# Patient Record
Sex: Male | Born: 1995 | Race: Black or African American | Hispanic: No | Marital: Single | Smoking: Never smoker
Health system: Southern US, Community
[De-identification: ages and names within clinical notes are randomized; demographics above are authoritative.]

## PROBLEM LIST (undated history)

## (undated) ENCOUNTER — Emergency Department (HOSPITAL_COMMUNITY): Payer: No Typology Code available for payment source

## (undated) DIAGNOSIS — J45909 Unspecified asthma, uncomplicated: Secondary | ICD-10-CM

## (undated) DIAGNOSIS — S022XXA Fracture of nasal bones, initial encounter for closed fracture: Secondary | ICD-10-CM

## (undated) DIAGNOSIS — S060X9A Concussion with loss of consciousness of unspecified duration, initial encounter: Secondary | ICD-10-CM

## (undated) HISTORY — DX: Concussion with loss of consciousness of unspecified duration, initial encounter: S06.0X9A

## (undated) HISTORY — DX: Fracture of nasal bones, initial encounter for closed fracture: S02.2XXA

---
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
## 2017-11-01 ENCOUNTER — Encounter (HOSPITAL_COMMUNITY): Payer: Self-pay | Admitting: *Deleted

## 2017-11-01 ENCOUNTER — Other Ambulatory Visit: Payer: Self-pay

## 2017-11-01 ENCOUNTER — Ambulatory Visit (HOSPITAL_COMMUNITY)
Admission: EM | Admit: 2017-11-01 | Discharge: 2017-11-01 | Disposition: A | Discharge status: Home / Self Care | Payer: Medicaid Other | Attending: Physician Assistant | Admitting: Physician Assistant

## 2017-11-01 DIAGNOSIS — R0781 Pleurodynia: Secondary | ICD-10-CM | POA: Diagnosis not present

## 2017-11-01 MED ORDER — NAPROXEN SODIUM 550 MG PO TABS: 550.0000 mg | ORAL_TABLET | Freq: Two times a day (BID) | ORAL | 0 refills | Status: DC

## 2017-11-01 NOTE — ED Provider Notes (Signed)
11/01/2017 3:34 PM   DOB: 1996-02-11 / MRN: [id number]  SUBJECTIVE:  Bruce Rios is a 22 y.o. male presenting for right-sided rib pain that started last Friday after a basketball game in which he was "going hard."  Denies shortness of breath and chest pain.  Denies leg swelling.  He has No Known Allergies.   He  has a past medical history of Asthma.    He  reports that he has never smoked. He has never used smokeless tobacco. He reports that he has current or past drug history. Drug: Marijuana. He reports that he does not drink alcohol. He  has no sexual activity history on file. The patient  has no past surgical history on file.  His family history is not on file.  Review of Systems  Constitutional: Positive for fever.  Gastrointestinal: Negative for nausea.  Musculoskeletal: Negative for myalgias.  Neurological: Negative for dizziness.    OBJECTIVE:  BP 129/74 (BP Location: Left Arm)   Pulse 74   Temp (!) 97.4 F (36.3 C) (Oral)   SpO2 100%   Wt Readings from Last 3 Encounters:  04/08/16 193 lb (87.5 kg) (92 %, Z= 1.42)*  02/18/16 190 lb 9.6 oz (86.5 kg) (92 %, Z= 1.38)*  11/22/15 192 lb 12.8 oz (87.5 kg) (93 %, Z= 1.47)*   * Growth percentiles are based on CDC (Boys, 2-20 Years) data.   Temp Readings from Last 3 Encounters:  11/01/17 (!) 97.4 F (36.3 C) (Oral)  12/04/16 98.2 F (36.8 C) (Oral)  11/15/16 98.1 F (36.7 C) (Oral)   BP Readings from Last 3 Encounters:  11/01/17 129/74  12/04/16 127/82  11/15/16 127/79   Pulse Readings from Last 3 Encounters:  11/01/17 74  12/04/16 67  11/15/16 64    Physical Exam  Constitutional: He is oriented to person, place, and time. He appears well-developed. He does not appear ill.  Eyes: Pupils are equal, round, and reactive to light. Conjunctivae and EOM are normal.  Cardiovascular: Normal rate, regular rhythm, S1 normal, S2 normal, normal heart sounds, intact distal pulses and normal pulses. Exam reveals no gallop  and no friction rub.  No murmur heard. Pulmonary/Chest: Effort normal. No stridor. No respiratory distress. He has no wheezes. He has no rales.    Abdominal: He exhibits no distension.  Musculoskeletal: Normal range of motion. He exhibits no edema.  Neurological: He is alert and oriented to person, place, and time. No cranial nerve deficit. Coordination normal.  Skin: Skin is warm and dry. He is not diaphoretic.  Psychiatric: He has a normal mood and affect.  Nursing note and vitals reviewed.   No results found for this or any previous visit (from the past 72 hour(s)).  No results found.  ASSESSMENT AND PLAN:   Rib pain on right side reassuring exam.  No leg swelling.  Lung sounds normal.  Advised he will need to get this time.    Discharge Instructions     Please take the naproxen as prescribed.  Take this with food.  If you need to you can take at thousand milligrams of Tylenol every 8 hours along with the naproxen.  If at any point become short of breath then please go to the emergency department.        The patient is advised to call or return to clinic if he does not see an improvement in symptoms, or to seek the care of the closest emergency department if he worsens with the above  plan.   Deliah Boston, MHS, PA-C 11/01/2017 3:34 PM   Ofilia Neas, PA-C 11/01/17 1536

## 2017-11-01 NOTE — ED Triage Notes (Signed)
Pt c/o of right rib pain

## 2017-11-01 NOTE — Discharge Instructions (Addendum)
Please take the naproxen as prescribed.  Take this with food.  If you need to you can take at thousand milligrams of Tylenol every 8 hours along with the naproxen.  If at any point become short of breath then please go to the emergency department.
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
## 2018-06-07 ENCOUNTER — Other Ambulatory Visit: Payer: Self-pay

## 2018-06-07 ENCOUNTER — Encounter (HOSPITAL_COMMUNITY): Payer: Self-pay | Admitting: Emergency Medicine

## 2018-06-07 ENCOUNTER — Ambulatory Visit (HOSPITAL_COMMUNITY)
Admission: EM | Admit: 2018-06-07 | Discharge: 2018-06-07 | Disposition: A | Discharge status: Home / Self Care | Payer: Medicaid Other | Attending: Urgent Care | Admitting: Urgent Care

## 2018-06-07 DIAGNOSIS — R52 Pain, unspecified: Secondary | ICD-10-CM | POA: Insufficient documentation

## 2018-06-07 DIAGNOSIS — J029 Acute pharyngitis, unspecified: Secondary | ICD-10-CM

## 2018-06-07 DIAGNOSIS — M7918 Myalgia, other site: Secondary | ICD-10-CM

## 2018-06-07 DIAGNOSIS — R05 Cough: Secondary | ICD-10-CM

## 2018-06-07 DIAGNOSIS — R519 Headache, unspecified: Secondary | ICD-10-CM

## 2018-06-07 DIAGNOSIS — R51 Headache: Secondary | ICD-10-CM | POA: Insufficient documentation

## 2018-06-07 DIAGNOSIS — R69 Illness, unspecified: Secondary | ICD-10-CM | POA: Insufficient documentation

## 2018-06-07 DIAGNOSIS — R5381 Other malaise: Secondary | ICD-10-CM | POA: Insufficient documentation

## 2018-06-07 DIAGNOSIS — J111 Influenza due to unidentified influenza virus with other respiratory manifestations: Secondary | ICD-10-CM

## 2018-06-07 MED ORDER — CETIRIZINE HCL 10 MG PO TABS: 10.0000 mg | ORAL_TABLET | Freq: Every day | ORAL | 11 refills | Status: DC

## 2018-06-07 MED ORDER — IBUPROFEN 600 MG PO TABS: 600.0000 mg | ORAL_TABLET | Freq: Four times a day (QID) | ORAL | 0 refills | Status: DC | PRN

## 2018-06-07 MED ORDER — PSEUDOEPHEDRINE HCL ER 120 MG PO TB12: 120.0000 mg | ORAL_TABLET | Freq: Two times a day (BID) | ORAL | 3 refills | Status: DC

## 2018-06-07 NOTE — ED Triage Notes (Signed)
Headache and fever for 2-3 days ago.  Patient also has a cough

## 2018-06-07 NOTE — Discharge Instructions (Signed)
You may take 500mg  Tylenol with ibuprofen 600mg  with food every 8 hours for pain and inflammation. For sore throat try using a honey-based tea. Use 3 teaspoons of honey with juice squeezed from half lemon. Place shaved pieces of ginger into 1/2-1 cup of water and warm over stove top. Then mix the ingredients and repeat every 4 hours as needed. Use Zyrtec, Sudafed to help with congestion, sinus headaches.

## 2018-06-07 NOTE — ED Provider Notes (Addendum)
  MRN: [id number] DOB: 15-May-1996  Subjective:   Din L Jennette Kettleeal is a 22 y.o. male presenting for 3 day history of body aches, fever, right temporal-frontal headache, worsening malaise. Has sinus congestion, mild sharp/achy throat pain, dry cough that elicits chest pain, nausea without vomiting, occasional and intermittent belly pain. Patient was sent home from work today due to his illness. Has tried APAP. Has a history of childhood asthma. Does not have a current albuterol inhaler. Is generally very healthy, last illness was around the same time each year. Denies smoking cigarettes. Does not take any current medications.    No Known Allergies  Past Medical History:  Diagnosis Date  . Asthma     Had adenoidectomy, wisdom tooth extraction.  Objective:   Vitals: BP 135/78 (BP Location: Right Arm)   Pulse 78   Temp 98.4 F (36.9 C) (Oral)   Resp 18   SpO2 100%   Physical Exam Constitutional:      General: He is not in acute distress.    Appearance: Normal appearance. He is well-developed and normal weight. He is not ill-appearing, toxic-appearing or diaphoretic.  HENT:     Head: Normocephalic and atraumatic.     Right Ear: Tympanic membrane, ear canal and external ear normal.     Left Ear: Tympanic membrane, ear canal and external ear normal.     Nose: No congestion or rhinorrhea.     Mouth/Throat:     Mouth: Mucous membranes are moist.     Pharynx: Oropharynx is clear. No oropharyngeal exudate or posterior oropharyngeal erythema.  Eyes:     General: No scleral icterus.       Right eye: No discharge.        Left eye: No discharge.     Extraocular Movements: Extraocular movements intact.     Pupils: Pupils are equal, round, and reactive to light.  Neck:     Musculoskeletal: Normal range of motion and neck supple. No neck rigidity or muscular tenderness.  Cardiovascular:     Rate and Rhythm: Normal rate and regular rhythm.     Pulses: Normal pulses.     Heart sounds: Normal  heart sounds. No murmur. No friction rub. No gallop.   Pulmonary:     Effort: Pulmonary effort is normal. No respiratory distress.     Breath sounds: Normal breath sounds. No stridor. No wheezing, rhonchi or rales.  Lymphadenopathy:     Cervical: No cervical adenopathy.  Skin:    General: Skin is warm and dry.  Neurological:     Mental Status: He is alert and oriented to person, place, and time.  Psychiatric:        Mood and Affect: Mood normal.        Behavior: Behavior normal.        Thought Content: Thought content normal.    Assessment and Plan :   Influenza-like illness  Body aches  Malaise  Acute nonintractable headache, unspecified headache type  Will manage for influenza-like illness, has reassuring physical exam findings. Advised supportive care, offered symptomatic relief. Return-to-clinic precautions discussed, patient verbalized understanding.       Wallis BambergMani, Ernesto Lashway, PA-C 06/07/18 1813    Wallis BambergMani, Acen Craun, PA-C 06/07/18 [phone]

## 2018-06-14 ENCOUNTER — Ambulatory Visit (INDEPENDENT_AMBULATORY_CARE_PROVIDER_SITE_OTHER): Payer: Self-pay | Admitting: Family Medicine

## 2018-06-14 VITALS — BP 114/80 | HR 73 | Temp 98.4°F | Wt 189.0 lb

## 2018-06-14 DIAGNOSIS — R6889 Other general symptoms and signs: Secondary | ICD-10-CM

## 2018-06-14 NOTE — Progress Notes (Signed)
  Subjective:    Patient ID: Bruce Rios, male    DOB: 06-13-1995, 23 y.o.   MRN: [id number]   CC: Flulike symptoms  HPI:  Flulike symptoms Patient reports that he was previously diagnosed with the flu at urgent care on 12/30.  At that time he was told to stay out of work until at least 1/02.  Patient reports that he has been having fevers and chills with body aches.  Denies any vomiting or diarrhea.  Does report that he has been having some nausea.  Patient denies any chest pain or shortness of breath.  He reports that he is overall feeling a little bit better but that he is still having fevers.  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 Patient states that his supervisor at [facility] would not take the excuse provided by the urgent care provider for him having the flu.  And he would like an excuse that would cover him to be out of work.  Smoking status reviewed  ROS: 10 point ROS is otherwise negative, except as mentioned in HPI  Patient Active Problem List   Diagnosis Date Noted  . Flu-like symptoms 06/16/2018  . RHINITIS, ALLERGIC 08/06/2006  . ASTHMA, PERSISTENT, SEVERE 08/06/2006  . ECZEMA, ATOPIC DERMATITIS 08/06/2006     Objective:  BP 114/80   Pulse 73   Temp 98.4 F (36.9 C)   Wt 189 lb (85.7 kg)   SpO2 97%   BMI 22.71 kg/m  Vitals and nursing note reviewed  General: NAD, pleasant, well-appearing Cardiac: RRR, normal heart sounds, no murmurs Respiratory: CTAB, normal effort Extremities: no edema or cyanosis. WWP. Skin: warm and dry, no rashes noted Neuro: alert and oriented, no focal deficits Psych: normal affect  Assessment & Plan:    Flu-like symptoms Patient previously diagnosed with the flu on 12/30.  Overall patient reports that he is improving in symptoms but has been febrile today at home.  Will write for a note for him to be out of work until he was least fever free for 24 hours given that he is  around many people at his job at [employer]. Otherwise continue symptomatic treatment for the flu. Given handout on nasal saline wash in order to help with his congestion.  Honey for cough.  Continue ibuprofen and Tylenol as needed for fever and pain. Patient's lungs are clear today with no respiratory distress.  Patient given strict return precautions.    [place] Casson Catena, DO Family Medicine Resident PGY-2

## 2018-06-14 NOTE — Patient Instructions (Addendum)
Thank you for coming to see me today. It was a pleasure! Today we talked about:   You have the flu and it should start to get better about 10-14 days after it started.    For your cough, try honey scheduled, may use in tea.   For your nasal congestion and runny nose, try using Afrin (generic is Oxymetazoline) twice daily for 3 days.  Do not use for longer that 3 days.   You may also try guaifenesin to help with the congestion.    Some other therapies you can try are: push fluids, rest, use vaporizer or mist prn, apply heat to sinuses prn and return office visit prn if symptoms persist or worsen.   Drinking warm liquids such as teas and soups can help with secretions and cough. A mist humidifier or vaporizer can work well to help with secretions and cough.  It is very important to clean the humidifier between use according to the instructions.    It was good to see you.  If you're still having trouble in the next week, come back and see [place].    Of course, if you start having trouble breathing, worsening fevers, vomiting and unable to hold down any fluids, or you have other concerns, don't hesitate to come back or go to the ED after hours.   Please follow-up with your regular doctor as needed.  If you have any questions or concerns, please do not hesitate to call the office at [phone].  Take Care,   [place] Griffen Frayne, DO  You may rinse out your sinuses as we talked about with the following product. There are coupons available. Please use DISTILLED or FILTERED water and clean out the product after use as directed.

## 2018-06-16 ENCOUNTER — Encounter: Payer: Self-pay | Admitting: Family Medicine

## 2018-06-16 DIAGNOSIS — R6889 Other general symptoms and signs: Secondary | ICD-10-CM | POA: Insufficient documentation

## 2018-06-16 NOTE — Assessment & Plan Note (Addendum)
Patient previously diagnosed with the flu on 12/30.  Overall patient reports that he is improving in symptoms but has been febrile today at home.  Will write for a note for him to be out of work until he was least fever free for 24 hours given that he is around many people at his job at [employer]. Otherwise continue symptomatic treatment for the flu. Given handout on nasal saline wash in order to help with his congestion.  Honey for cough.  Continue ibuprofen and Tylenol as needed for fever and pain. Patient's lungs are clear today with no respiratory distress.  Patient given strict return precautions.

## 2018-06-18 ENCOUNTER — Telehealth: Payer: Self-pay | Admitting: Family Medicine

## 2018-06-18 NOTE — Telephone Encounter (Signed)
Patient's Mom dropped of forms to be signed. She will pick up when completed.

## 2018-06-18 NOTE — Telephone Encounter (Signed)
Forwarding to [facility] team. Aquilla Solian, CMA

## 2018-06-18 NOTE — Telephone Encounter (Signed)
Clinical info completed on FMLA form.  Place form in Dr. Lyttle's box for completion.  Zimmerman Rumple, April D, CMA  

## 2018-06-21 NOTE — Telephone Encounter (Signed)
Dr. [place] saw him and wrote him out for the flu--so I gave her the form to fill out Bruce Rios

## 2018-06-22 NOTE — Telephone Encounter (Signed)
Attempted to contact pt and mother with numbers provided on form and in chart without success, and unable to LVM. Pts forms are up front for pick up, if mom calls asking about them.

## 2018-06-22 NOTE — Telephone Encounter (Signed)
Form completed. Will place in RN box

## 2018-07-05 NOTE — Telephone Encounter (Signed)
Mom called to check status.  Informed that they were ready for pickup. Jamarian Jacinto, Maryjo Rochester, CMA

## 2018-07-16 ENCOUNTER — Ambulatory Visit
Admission: EM | Admit: 2018-07-16 | Discharge: 2018-07-16 | Disposition: A | Discharge status: Home / Self Care | Payer: Medicaid Other | Attending: Family Medicine | Admitting: Family Medicine

## 2018-07-16 ENCOUNTER — Encounter: Payer: Self-pay | Admitting: Emergency Medicine

## 2018-07-16 DIAGNOSIS — S161XXA Strain of muscle, fascia and tendon at neck level, initial encounter: Secondary | ICD-10-CM

## 2018-07-16 MED ORDER — IBUPROFEN 800 MG PO TABS: 800.0000 mg | ORAL_TABLET | Freq: Three times a day (TID) | ORAL | 0 refills | Status: DC

## 2018-07-16 MED ORDER — CYCLOBENZAPRINE HCL 5 MG PO TABS: 5.0000 mg | ORAL_TABLET | Freq: Two times a day (BID) | ORAL | 0 refills | Status: DC | PRN
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
## 2018-07-16 NOTE — Discharge Instructions (Signed)
Your pain may worsen over the next 2 to 3 days he may develop some other aches and pains.  In total I would expect your pain to gradually improve over the following 2 to 3 weeks afterwards.  Use anti-inflammatories for pain/swelling. You may take up to 800 mg Ibuprofen every 8 hours with food. You may supplement Ibuprofen with Tylenol [phone] mg every 8 hours.   You may use flexeril as needed to help with pain. This is a muscle relaxer and causes sedation- please use only at bedtime or when you will be home and not have to drive/work-begin with 1 tablet, may increase to 2 if not causing significant drowsiness  Alternate ice and heat  Recommend gradual gentle neck stretching  Follow-up if symptoms not resolving or worsening
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
## 2018-07-16 NOTE — ED Notes (Signed)
Patient able to ambulate independently  

## 2018-07-24 ENCOUNTER — Other Ambulatory Visit: Payer: Self-pay

## 2018-07-24 ENCOUNTER — Ambulatory Visit
Admission: EM | Admit: 2018-07-24 | Discharge: 2018-07-24 | Disposition: A | Discharge status: Home / Self Care | Payer: Medicaid Other | Attending: Family Medicine | Admitting: Family Medicine

## 2018-07-24 DIAGNOSIS — S46811D Strain of other muscles, fascia and tendons at shoulder and upper arm level, right arm, subsequent encounter: Secondary | ICD-10-CM

## 2018-07-24 MED ORDER — DICLOFENAC SODIUM 75 MG PO TBEC: 75.0000 mg | DELAYED_RELEASE_TABLET | Freq: Two times a day (BID) | ORAL | 0 refills | Status: DC
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
## 2018-07-24 NOTE — Discharge Instructions (Addendum)
Continue the hot showers and gentle range of motion of the neck  See Dr. Denny Levy next week

## 2018-09-20 ENCOUNTER — Encounter: Payer: Self-pay | Admitting: Family Medicine

## 2018-09-20 ENCOUNTER — Other Ambulatory Visit: Payer: Self-pay

## 2018-09-20 ENCOUNTER — Telehealth (INDEPENDENT_AMBULATORY_CARE_PROVIDER_SITE_OTHER): Payer: Self-pay | Admitting: Family Medicine

## 2018-09-20 DIAGNOSIS — B349 Viral infection, unspecified: Secondary | ICD-10-CM

## 2018-09-20 DIAGNOSIS — J4530 Mild persistent asthma, uncomplicated: Secondary | ICD-10-CM

## 2018-09-20 MED ORDER — BECLOMETHASONE DIPROP HFA 80 MCG/ACT IN AERB: 1.0000 | INHALATION_SPRAY | Freq: Two times a day (BID) | RESPIRATORY_TRACT | 3 refills | Status: DC

## 2018-09-20 MED ORDER — ALBUTEROL SULFATE HFA 108 (90 BASE) MCG/ACT IN AERS: 2.0000 | INHALATION_SPRAY | Freq: Four times a day (QID) | RESPIRATORY_TRACT | 0 refills | Status: DC | PRN

## 2018-09-20 NOTE — Progress Notes (Signed)
Please letter to patient's home.  Bruce Starr, MD  Family Medicine Teaching Service

## 2018-09-20 NOTE — Progress Notes (Signed)
[facility] Telemedicine Visit  Patient consented to have virtual visit. Method of visit: Video was attempted, but technology challenges prevented patient from using video, so visit was conducted via telephone.  Encounter participants: Patient: Bruce Rios - located at home with mother  Provider: [name] - located at [facility]    Chief Complaint: Not feeling well for 3 days  HPI: Bruce Rios is a 23 year old gentleman with no significant medical history presenting via virtual visit with concerns for headache nausea and general malaise.  The patient currently works in a distribution center.  He reports for 3 days he has had a moderate quality right-sided throbbing headache.  He has a history of intermittent headaches.  This is similar to prior.  The headache comes and goes during the day.  He also endorses some chills, malaise, fatigue and nausea.  He also has a dry cough.  He specifically denies documented fevers, vomiting, diarrhea, dyspnea on exertion, chest pain or severe headache.  He has been eating and drinking well.  Denies melena or hematochezia.  He has not been using any illicit substances.  He has not been drinking alcohol.  He reports he was told to stay home from work for 14 days.  He would like to be tested for coronavirus.    ROS: per HPI  Pertinent PMHx:  Asthma, currently well controlled.  Patient not on any controller medications currently.  Exam:  Respiratory: Speaking in full sentences.  No signs of respiratory distress.  Exam limited by phone consultation  Assessment/Plan:   Diagnoses and all orders for this visit:  Viral syndrome, reviewed symptomatic care.  Recommended Tylenol rather than ibuprofen.  Reviewed strict return precautions.  It is certainly possible that the patient has a mild very mild case of coronavirus.  We reviewed that the illness could worsen.  We will send a refill for his albuterol in the case that he needs to use  this.  Should he become sort of breath or have other symptoms he should present to the emergency department.  Work note given for remainder of week.  Time spent during visit with patient: 10 minutes

## 2019-01-19 ENCOUNTER — Ambulatory Visit
Admission: EM | Admit: 2019-01-19 | Discharge: 2019-01-19 | Disposition: A | Discharge status: Home / Self Care | Payer: Medicaid Other | Attending: Physician Assistant | Admitting: Physician Assistant

## 2019-01-19 DIAGNOSIS — G8929 Other chronic pain: Secondary | ICD-10-CM

## 2019-01-19 DIAGNOSIS — M545 Low back pain, unspecified: Secondary | ICD-10-CM

## 2019-01-19 DIAGNOSIS — M549 Dorsalgia, unspecified: Secondary | ICD-10-CM

## 2019-01-19 DIAGNOSIS — M542 Cervicalgia: Secondary | ICD-10-CM

## 2019-01-19 MED ORDER — MELOXICAM 7.5 MG PO TABS: 7.5000 mg | ORAL_TABLET | Freq: Every day | ORAL | 0 refills | Status: DC
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
## 2019-01-19 NOTE — Discharge Instructions (Signed)
Start Mobic. Do not take ibuprofen (motrin/advil)/ naproxen (aleve) while on mobic. Follow up with your PCP for further evaluation if symptoms not improving.
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
## 2019-02-23 ENCOUNTER — Other Ambulatory Visit: Payer: Self-pay

## 2019-02-23 ENCOUNTER — Ambulatory Visit
Admission: EM | Admit: 2019-02-23 | Discharge: 2019-02-23 | Disposition: A | Discharge status: Home / Self Care | Payer: Medicaid Other | Attending: Physician Assistant | Admitting: Physician Assistant

## 2019-02-23 DIAGNOSIS — R1030 Lower abdominal pain, unspecified: Secondary | ICD-10-CM

## 2019-02-23 MED ORDER — POLYETHYLENE GLYCOL 3350 17 G PO PACK: 17.0000 g | PACK | Freq: Every day | ORAL | 0 refills | Status: DC

## 2019-02-23 NOTE — Discharge Instructions (Addendum)
Eat smaller more frequent meals. Take miralax daily to help with possible constipation. Follow up with PCP for erectile dysfunction. If no improvement in stomach pain follow up with PCP. Go to ED for fever, severe abdominal pain that causes you to double over with nausea and/or vomiting.

## 2019-02-23 NOTE — ED Triage Notes (Signed)
Pt presents to UC w/ c/o abdominal pain x4 days. Pt deneies N/V or diarrhea. Pt reports feeling weak.

## 2019-02-23 NOTE — ED Provider Notes (Signed)
[facility]    CSN: [id number] Arrival date & time: 02/23/19  1218      History   Chief Complaint No chief complaint on file.   HPI Bruce Rios is a 23 y.o. male.   23 yo presents with bilateral lower abdominal pain x 4 days. He reports cutting his left testicle while shaving 4 days ago. He is concerned about blood loss because he "bled a lot". Bleeding subsided day of incident, denies current bleeding. He denies dizziness or syncope. He felt as if abdominal pain occurred shortly after bleeding subsided. States pain is hard to describes but makes him "feel like I could throw up", however, he then denies nausea. Denies V/D. Denies fever. Denies heartburn. Reports daily bowel movement with occasional hard stools and straining. Reports eating one large meal a day. Food improves stomach pain for a short period.   Also concerned that he has been unable to maintain an erection for the last few days. Denies discharge, lesion, or dysuria. He is sexually active with one partner and uses condoms.      Past Medical History:  Diagnosis Date  . Asthma   . Closed fracture of nasal bone 10/24/2015   10/20/2015: CT BRAIN AND FACIAL BONES WO CONTRAST:  IMPRESSION:  - Suspected very subtle subluxation between the frontal process of the maxilla on the left and the left nasal bone, versus a tiny fracture. Adjacent mild soft tissue swelling. No other fracture demonstrated. - No acute intracranial pathology demonstrated.   . Concussion with loss of consciousness 10/24/2015    Patient Active Problem List   Diagnosis Date Noted  . Flu-like symptoms 06/16/2018  . RHINITIS, ALLERGIC 08/06/2006  . ASTHMA, PERSISTENT, SEVERE 08/06/2006  . ECZEMA, ATOPIC DERMATITIS 08/06/2006    History reviewed. No pertinent surgical history.     Home Medications    Prior to Admission medications   Medication Sig Start Date End Date Taking? Authorizing Provider  albuterol (PROVENTIL HFA;VENTOLIN HFA)  108 (90 Base) MCG/ACT inhaler Inhale 2 puffs into the lungs every 6 (six) hours as needed for wheezing or shortness of breath. 09/20/18   Martyn Malay, MD  beclomethasone (QVAR REDIHALER) 80 MCG/ACT inhaler Inhale 1 puff into the lungs 2 (two) times daily. 09/20/18   Martyn Malay, MD  cetirizine (ZYRTEC) 10 MG tablet Take 1 tablet (10 mg total) by mouth daily. 06/07/18   Jaynee Eagles, PA-C  meloxicam (MOBIC) 7.5 MG tablet Take 1 tablet (7.5 mg total) by mouth daily. 01/19/19   Tasia Catchings, Taliyah Watrous V, PA-C  polyethylene glycol (MIRALAX) 17 g packet Take 17 g by mouth daily. 02/23/19   Ok Edwards, PA-C  pseudoephedrine (SUDAFED 12 HOUR) 120 MG 12 hr tablet Take 1 tablet (120 mg total) by mouth 2 (two) times daily. 06/07/18   Jaynee Eagles, PA-C    Family History Family History  Problem Relation Age of Onset  . Healthy Mother   . Healthy Father     Social History Social History   Tobacco Use  . Smoking status: Never Smoker  . Smokeless tobacco: Never Used  Substance Use Topics  . Alcohol use: No  . Drug use: Not Currently    Types: Marijuana     Allergies   Patient has no known allergies.   Review of Systems Review of Systems  See HPI.    Physical Exam Triage Vital Signs ED Triage Vitals  Enc Vitals Group     BP 02/23/19 1230 135/83  Pulse Rate 02/23/19 1230 80     Resp 02/23/19 1230 16     Temp 02/23/19 1230 97.8 F (36.6 C)     Temp Source 02/23/19 1230 Oral     SpO2 02/23/19 1230 98 %     Weight --      Height --      Head Circumference --      Peak Flow --      Pain Score 02/23/19 1235 7     Pain Loc --      Pain Edu? --      Excl. in [place]? --    No data found.  Updated Vital Signs BP 135/83 (BP Location: Left Arm)   Pulse 80   Temp 97.8 F (36.6 C) (Oral)   Resp 16   SpO2 98%   Physical Exam Exam conducted with a chaperone present.  Constitutional:      General: He is not in acute distress.    Appearance: Normal appearance. He is not ill-appearing,  toxic-appearing or diaphoretic.  HENT:     Head: Normocephalic and atraumatic.  Pulmonary:     Effort: Pulmonary effort is normal. No respiratory distress.  Abdominal:     General: Abdomen is flat. Bowel sounds are normal. There is no distension.     Palpations: Abdomen is soft. There is no mass.     Tenderness: There is no abdominal tenderness. There is no right CVA tenderness, left CVA tenderness, guarding or rebound.     Hernia: No hernia is present.  Genitourinary:    Penis: Normal.      Scrotum/Testes: Normal.     Comments: No abrasions/wounds seen. No bleeding visible. Patient cannot find site of injury.  Skin:    General: Skin is warm and dry.  Neurological:     Mental Status: He is alert and oriented to person, place, and time.  Psychiatric:        Mood and Affect: Mood normal.        Behavior: Behavior normal.      UC Treatments / Results  Labs (all labs ordered are listed, but only abnormal results are displayed) Labs Reviewed - No data to display  EKG   Radiology No results found.  Procedures Procedures (including critical care time)  Medications Ordered in UC Medications - No data to display  Initial Impression / Assessment and Plan / UC Course  I have reviewed the triage vital signs and the nursing notes.  Pertinent labs & imaging results that were available during my care of the patient were reviewed by me and considered in my medical decision making (see chart for details).   Lower suspicion for acute abdomen given afebrile, no tenderness, guarding or rebound. Discussed possibility of constipation as cause of discomfort. Miralax given. Discussed eating bland diet and smaller meals throughout day. Reassurances provided to patient in terms of concerns for blood loss. Discussed established care with a PCP for evaluation of ED. Return precautions given.    Final Clinical Impressions(s) / UC Diagnoses   Final diagnoses:  Abdominal pain, left lower  quadrant  Abdominal pain, chronic, right lower quadrant   ED Prescriptions    Medication Sig Dispense Auth. Provider   polyethylene glycol (MIRALAX) 17 g packet Take 17 g by mouth daily. [address] each Threasa AlphaYu, Zay Yeargan V, PA-C        Kylian Loh V, PA-C 02/23/19 1737

## 2019-04-27 ENCOUNTER — Encounter: Payer: Self-pay | Admitting: Family Medicine

## 2019-04-27 ENCOUNTER — Ambulatory Visit (INDEPENDENT_AMBULATORY_CARE_PROVIDER_SITE_OTHER): Payer: Medicaid Other | Admitting: Family Medicine

## 2019-04-27 ENCOUNTER — Other Ambulatory Visit: Payer: Self-pay

## 2019-04-27 VITALS — BP 118/82 | HR 71 | Ht 76.0 in | Wt 193.2 lb

## 2019-04-27 DIAGNOSIS — N529 Male erectile dysfunction, unspecified: Secondary | ICD-10-CM | POA: Diagnosis not present

## 2019-04-27 NOTE — Patient Instructions (Signed)
I am checking some blood work and I have sent a referral to Urology. The Urology office should call you with an appointment. They should call you in the next 10 days. If they have not called in 10 days, please leave me a message at my office. I will send you a note about your blood wrk.

## 2019-04-28 ENCOUNTER — Encounter: Payer: Self-pay | Admitting: Family Medicine

## 2019-04-28 DIAGNOSIS — N529 Male erectile dysfunction, unspecified: Secondary | ICD-10-CM | POA: Insufficient documentation

## 2019-04-28 LAB — BASIC METABOLIC PANEL
BUN/Creatinine Ratio: 11 (ref 9–20)
BUN: 10 mg/dL (ref 6–20)
CO2: 25 mmol/L (ref 20–29)
Calcium: 9.6 mg/dL (ref 8.7–10.2)
Chloride: 101 mmol/L (ref 96–106)
Creatinine, Ser: 0.95 mg/dL (ref 0.76–1.27)
GFR calc Af Amer: 132 mL/min/{1.73_m2} (ref >59)
GFR calc non Af Amer: 114 mL/min/{1.73_m2} (ref >59)
Glucose: 88 mg/dL (ref 65–99)
Potassium: 4.1 mmol/L (ref 3.5–5.2)
Sodium: 140 mmol/L (ref 134–144)

## 2019-04-28 LAB — TESTOSTERONE, FREE, TOTAL, SHBG
Sex Hormone Binding: 35.9 nmol/L (ref 16.5–55.9)
Testosterone, Free: 23.4 pg/mL (ref 9.3–26.5)
Testosterone: 609 ng/dL (ref 264–916)

## 2019-04-28 LAB — TSH: TSH: 3.54 u[IU]/mL (ref 0.450–4.500)

## 2019-04-28 NOTE — Assessment & Plan Note (Signed)
It was difficult to get a complete history from him.  I think he is somewhat embarrassed.  We will check some blood work to rule out diabetes mellitus etc.  He has no history for concern for STDs and he really does not want to do that testing today.  I think he needs to be seen by urology anyway so I will defer that today.  Urology referral made. Greater than 50% of our 25 minute office visit was spent in counseling and education regarding these issues.

## 2019-04-28 NOTE — Progress Notes (Signed)
    CHIEF COMPLAINT / HPI: Having problems with erections for about 2 to 3 months.  No longer having a.m. erections on a regular basis.  Erection is sometimes insufficient to complete intercourse.   No burning on urination.  No penile discharge.  No lesions noted in the GU area.  Sexually active with one partner.  He reports this started when he was shaving in the GU area and accidentally nicked one of his testicles.  Was seen at the emergency room for this they were unable to see much of an injury at all.  REVIEW OF SYSTEMS: No unusual weight change, no fever, no cough.  See HPI.  PERTINENT  PMH / PSH: I have reviewed the patient's medications, allergies, past medical and surgical history, smoking status and updated in the EMR as appropriate.   OBJECTIVE: GENERAL: Well-developed male no acute distress next ABDOMEN: Soft, positive bowel sounds nontender nondistended GU: Circumcised penis with no lesions or abnormalities noted.  Testicles are descended bilaterally.  No lymphadenopathy noted in the groin area.  SKIN: No lacerations or lesions are noted. ASSESSMENT / PLAN:   No problem-specific Assessment & Plan notes found for this encounter.

## 2019-04-29 ENCOUNTER — Encounter: Payer: Self-pay | Admitting: Family Medicine

## 2019-05-26 DIAGNOSIS — F5221 Male erectile disorder: Secondary | ICD-10-CM | POA: Diagnosis not present

## 2019-06-24 ENCOUNTER — Other Ambulatory Visit: Payer: Self-pay

## 2019-06-24 ENCOUNTER — Emergency Department (HOSPITAL_COMMUNITY)
Admission: EM | Admit: 2019-06-24 | Discharge: 2019-06-24 | Disposition: A | Discharge status: Home / Self Care | Payer: Medicaid Other | Attending: Emergency Medicine | Admitting: Emergency Medicine

## 2019-06-24 ENCOUNTER — Encounter (HOSPITAL_COMMUNITY): Payer: Self-pay | Admitting: Emergency Medicine

## 2019-06-24 DIAGNOSIS — Z79899 Other long term (current) drug therapy: Secondary | ICD-10-CM | POA: Insufficient documentation

## 2019-06-24 DIAGNOSIS — R6883 Chills (without fever): Secondary | ICD-10-CM | POA: Diagnosis not present

## 2019-06-24 DIAGNOSIS — U071 COVID-19: Secondary | ICD-10-CM | POA: Diagnosis not present

## 2019-06-24 DIAGNOSIS — J45909 Unspecified asthma, uncomplicated: Secondary | ICD-10-CM | POA: Insufficient documentation

## 2019-06-24 DIAGNOSIS — Z20822 Contact with and (suspected) exposure to covid-19: Secondary | ICD-10-CM

## 2019-06-24 NOTE — Discharge Instructions (Signed)
Please take Tylenol (acetaminophen) to relieve your pain.  You may take tylenol, up to 1,000 mg (two extra strength pills).  Do not take more than 3,000 mg tylenol in a 24 hour period.  Please check all medication labels as many medications such as pain and cold medications may contain tylenol. Please do not drink alcohol while taking this medication. ; Please make sure you are drinking plenty of fluid and staying well-hydrated.

## 2019-06-24 NOTE — ED Provider Notes (Signed)
[name] Provider Note   CSN: [id number] Arrival date & time: 06/24/19  1750     History Chief Complaint  Patient presents with  . Covid exposure  . Chills    Bruce Rios is a 24 y.o. male with a past medical history of mild asthma presents today for evaluation of chills since last night.  He reports that he was exposed to someone with Covid.  Since last night he has had chills, myalgias/arthralgias.  He denies any specific chest pain, abdominal pain, nausea, vomiting, or diarrhea.  He has not taken any NSAIDs or Tylenol prior to arrival.  Reports he has a dry cough.  He denies shortness of breath.  HPI     Past Medical History:  Diagnosis Date  . Asthma   . Closed fracture of nasal bone 10/24/2015   10/20/2015: CT BRAIN AND FACIAL BONES WO CONTRAST:  IMPRESSION:  - Suspected very subtle subluxation between the frontal process of the maxilla on the left and the left nasal bone, versus a tiny fracture. Adjacent mild soft tissue swelling. No other fracture demonstrated. - No acute intracranial pathology demonstrated.   . Concussion with loss of consciousness 10/24/2015    Patient Active Problem List   Diagnosis Date Noted  . Erectile dysfunction 04/28/2019  . RHINITIS, ALLERGIC 08/06/2006  . ASTHMA, PERSISTENT, SEVERE 08/06/2006  . ECZEMA, ATOPIC DERMATITIS 08/06/2006    History reviewed. No pertinent surgical history.     Family History  Problem Relation Age of Onset  . Healthy Mother   . Healthy Father     Social History   Tobacco Use  . Smoking status: Never Smoker  . Smokeless tobacco: Never Used  Substance Use Topics  . Alcohol use: No  . Drug use: Not Currently    Types: Marijuana    Home Medications Prior to Admission medications   Medication Sig Start Date End Date Taking? Authorizing Provider  albuterol (PROVENTIL HFA;VENTOLIN HFA) 108 (90 Base) MCG/ACT inhaler Inhale 2 puffs into the lungs every 6 (six) hours as  needed for wheezing or shortness of breath. 09/20/18   Bruce Chandler, MD  beclomethasone (QVAR REDIHALER) 80 MCG/ACT inhaler Inhale 1 puff into the lungs 2 (two) times daily. 09/20/18   Bruce Chandler, MD  cetirizine (ZYRTEC) 10 MG tablet Take 1 tablet (10 mg total) by mouth daily. 06/07/18   Bruce Bamberg, PA-C    Allergies    Patient has no known allergies.  Review of Systems   Review of Systems  Constitutional: Positive for activity change, chills and fatigue. Negative for appetite change and fever.  Respiratory: Positive for cough. Negative for chest tightness and shortness of breath.   Cardiovascular: Negative for chest pain.  Gastrointestinal: Negative for diarrhea, nausea, rectal pain and vomiting.  Genitourinary: Negative for dysuria and urgency.  Musculoskeletal: Positive for arthralgias and myalgias.  Neurological: Positive for headaches. Negative for weakness.  Psychiatric/Behavioral: Negative for confusion.  All other systems reviewed and are negative.   Physical Exam Updated Vital Signs BP 132/84   Pulse (!) 113   Temp 99.5 F (37.5 C) (Oral)   Resp 16   SpO2 99%   Physical Exam Vitals and nursing note reviewed.  Constitutional:      General: He is not in acute distress.    Appearance: He is well-developed. He is not diaphoretic.  HENT:     Head: Normocephalic and atraumatic.     Mouth/Throat:     Mouth: Mucous  membranes are moist.  Eyes:     General: No scleral icterus.       Right eye: No discharge.        Left eye: No discharge.     Conjunctiva/sclera: Conjunctivae normal.  Cardiovascular:     Rate and Rhythm: Regular rhythm. Tachycardia present.     Comments: HR 105 on exam Pulmonary:     Effort: Pulmonary effort is normal. No respiratory distress.     Breath sounds: No stridor.  Abdominal:     General: There is no distension.  Musculoskeletal:        General: No deformity.     Cervical back: Normal range of motion and neck supple.  Skin:     General: Skin is warm and dry.  Neurological:     General: No focal deficit present.     Mental Status: He is alert.     Motor: No abnormal muscle tone.     Gait: Gait normal.  Psychiatric:        Mood and Affect: Mood normal.        Behavior: Behavior normal.     ED Results / Procedures / Treatments   Labs (all labs ordered are listed, but only abnormal results are displayed) Labs Reviewed  NOVEL CORONAVIRUS, NAA (HOSP ORDER, SEND-OUT TO REF LAB; TAT 18-24 HRS)    EKG None  Radiology No results found.  Procedures Procedures (including critical care time)  Medications Ordered in ED Medications - No data to display  ED Course  I have reviewed the triage vital signs and the nursing notes.  Pertinent labs & imaging results that were available during my care of the patient were reviewed by me and considered in my medical decision making (see chart for details). I personally had patient ambulate in room, his oxygen saturations remained 100% with a good waveform.   MDM Rules/Calculators/A&P                     Patient presents today for evaluation of myalgias, arthralgias, headache, and dry cough since last night.  He has a known coronavirus exposure and is requesting testing today.  No NSAIDs or APAP taken prior to arrival. Here he is generally well-appearing, mildly tachycardic without hypotension.  He denies shortness of breath, and as he has been sick for approximately 24 hours I do not think a chest x-ray would be helpful at this time.  Recommended conservative care.  Coronavirus testing will be sent.  Bruce Rios was evaluated in Emergency Department on 06/24/2019 for the symptoms described in the history of present illness. He was evaluated in the context of the global COVID-19 pandemic, which necessitated consideration that the patient might be at risk for infection with the SARS-CoV-2 virus that causes COVID-19. Institutional protocols and algorithms that pertain to the  evaluation of patients at risk for COVID-19 are in a state of rapid change based on information released by regulatory bodies including the CDC and federal and state organizations. These policies and algorithms were followed during the patient's care in the ED.  Return precautions were discussed with patient who states their understanding.  At the time of discharge patient denied any unaddressed complaints or concerns.  Patient is agreeable for discharge home.  Note: Portions of this report may have been transcribed using voice recognition software. Every effort was made to ensure accuracy; however, inadvertent computerized transcription errors may be present   Final Clinical Impression(s) / ED Diagnoses Final diagnoses:  Suspected COVID-19 virus infection  Chills    Rx / DC Orders ED Discharge Orders    None       Ollen Gross 06/24/19 2033    Hayden Rasmussen, MD 06/25/19 1039

## 2019-06-24 NOTE — ED Notes (Signed)
An After Visit Summary was printed and given to the patient. Discharge instructions given and no further questions at this time.  

## 2019-06-24 NOTE — ED Triage Notes (Signed)
Pt reports that he was exposed to someone with Covid. Reports chills since last night. Wants Covid test.

## 2019-06-26 LAB — NOVEL CORONAVIRUS, NAA (HOSP ORDER, SEND-OUT TO REF LAB; TAT 18-24 HRS): SARS-CoV-2, NAA: DETECTED — AB

## 2019-07-07 ENCOUNTER — Other Ambulatory Visit: Payer: Medicaid Other

## 2019-09-12 ENCOUNTER — Other Ambulatory Visit: Payer: Self-pay

## 2019-09-12 ENCOUNTER — Ambulatory Visit
Admission: EM | Admit: 2019-09-12 | Discharge: 2019-09-12 | Disposition: A | Discharge status: Home / Self Care | Payer: Medicaid Other | Attending: Emergency Medicine | Admitting: Emergency Medicine

## 2019-09-12 ENCOUNTER — Encounter: Payer: Self-pay | Admitting: Emergency Medicine

## 2019-09-12 DIAGNOSIS — L989 Disorder of the skin and subcutaneous tissue, unspecified: Secondary | ICD-10-CM

## 2019-09-12 MED ORDER — DOXYCYCLINE HYCLATE 100 MG PO CAPS: 100.0000 mg | ORAL_CAPSULE | Freq: Two times a day (BID) | ORAL | 0 refills | Status: AC

## 2019-09-12 NOTE — ED Provider Notes (Signed)
[facility]    CSN: [id number] Arrival date & time: 09/12/19  1202      History   Chief Complaint Chief Complaint  Patient presents with  . Warts    HPI Bruce Rios is a 24 y.o. male with history of asthma presenting for left fifth digit skin lesion.  Patient states this has been ongoing for almost a year now, slowly getting larger.  Denies pain unless he hits it.  States that he noticed a fish-like smell a few days ago, prompting evaluation today.  Patient is not been previously evaluated for this.  Cannot remember specific inciting event, trauma/injury.  Patient has been using medicated Band-Aids without improvement.   Past Medical History:  Diagnosis Date  . Asthma   . Closed fracture of nasal bone 10/24/2015   10/20/2015: CT BRAIN AND FACIAL BONES WO CONTRAST:  IMPRESSION:  - Suspected very subtle subluxation between the frontal process of the maxilla on the left and the left nasal bone, versus a tiny fracture. Adjacent mild soft tissue swelling. No other fracture demonstrated. - No acute intracranial pathology demonstrated.   . Concussion with loss of consciousness 10/24/2015    Patient Active Problem List   Diagnosis Date Noted  . Erectile dysfunction 04/28/2019  . RHINITIS, ALLERGIC 08/06/2006  . ASTHMA, PERSISTENT, SEVERE 08/06/2006  . ECZEMA, ATOPIC DERMATITIS 08/06/2006    History reviewed. No pertinent surgical history.     Home Medications    Prior to Admission medications   Medication Sig Start Date End Date Taking? Authorizing Provider  albuterol (PROVENTIL HFA;VENTOLIN HFA) 108 (90 Base) MCG/ACT inhaler Inhale 2 puffs into the lungs every 6 (six) hours as needed for wheezing or shortness of breath. 09/20/18   Martyn Malay, MD  beclomethasone (QVAR REDIHALER) 80 MCG/ACT inhaler Inhale 1 puff into the lungs 2 (two) times daily. 09/20/18   Martyn Malay, MD  doxycycline (VIBRAMYCIN) 100 MG capsule Take 1 capsule (100 mg total) by mouth 2 (two)  times daily for 5 days. 09/12/19 09/17/19  Hall-Potvin, [place], PA-C  cetirizine (ZYRTEC) 10 MG tablet Take 1 tablet (10 mg total) by mouth daily. 06/07/18 09/12/19  Jaynee Eagles, PA-C    Family History Family History  Problem Relation Age of Onset  . Healthy Mother   . Healthy Father     Social History Social History   Tobacco Use  . Smoking status: Never Smoker  . Smokeless tobacco: Never Used  Substance Use Topics  . Alcohol use: No    Comment: occ  . Drug use: Not Currently    Types: Marijuana     Allergies   Patient has no known allergies.   Review of Systems As per HPI   Physical Exam Triage Vital Signs ED Triage Vitals  Enc Vitals Group     BP      Pulse      Resp      Temp      Temp src      SpO2      Weight      Height      Head Circumference      Peak Flow      Pain Score      Pain Loc      Pain Edu?      Excl. in [place]?    No data found.  Updated Vital Signs BP 121/74 (BP Location: Left Arm)   Pulse 75   Temp 98.4 F (36.9 C) (Oral)  Resp 16   SpO2 97%   Visual Acuity Right Eye Distance:   Left Eye Distance:   Bilateral Distance:    Right Eye Near:   Left Eye Near:    Bilateral Near:     Physical Exam Constitutional:      General: He is not in acute distress.    Appearance: He is normal weight. He is not ill-appearing.  HENT:     Head: Normocephalic and atraumatic.  Eyes:     General: No scleral icterus.    Pupils: Pupils are equal, round, and reactive to light.  Cardiovascular:     Rate and Rhythm: Normal rate.  Pulmonary:     Effort: Pulmonary effort is normal. No respiratory distress.     Breath sounds: No wheezing.  Musculoskeletal:        General: No swelling or tenderness. Normal range of motion.  Skin:    Capillary Refill: Capillary refill takes less than 2 seconds.     Coloration: Skin is not jaundiced or pale.     Findings: Lesion present. No bruising or erythema.     Comments: 1 cm cutaneous lesion over PIP of  left fifth digit.  No open wound, mild TTP.  No fluctuance, surrounding induration or erythema, streaking.  Neurological:     General: No focal deficit present.     Mental Status: He is alert and oriented to person, place, and time.      UC Treatments / Results  Labs (all labs ordered are listed, but only abnormal results are displayed) Labs Reviewed - No data to display  EKG   Radiology No results found.  Procedures Procedures (including critical care time)  Medications Ordered in UC Medications - No data to display  Initial Impression / Assessment and Plan / UC Course  I have reviewed the triage vital signs and the nursing notes.  Pertinent labs & imaging results that were available during my care of the patient were reviewed by me and considered in my medical decision making (see chart for details).     Afebrile, nontoxic in office today.  Will provide short course of antibiotics as patient is immunocompetent, though noting malodor to adventitious skin lesion.  Provided contact information for dermatology for further evaluation as excision/biopsy may be needed.  Return precautions discussed, patient verbalized understanding and is agreeable to plan. Final Clinical Impressions(s) / UC Diagnoses   Final diagnoses:  Finger lesion     Discharge Instructions     Take antibiotic twice daily with food. Important to keep covered, though allowed to breathe. Very important to call dermatologist for further evaluation/possible removal. May call your primary care to put in a referral if you have difficulty scheduling this appointment on your own.    ED Prescriptions    Medication Sig Dispense Auth. Provider   doxycycline (VIBRAMYCIN) 100 MG capsule Take 1 capsule (100 mg total) by mouth 2 (two) times daily for 5 days. 10 capsule Hall-Potvin, [place], PA-C     PDMP not reviewed this encounter.   Hall-Potvin, [place], [place] 09/12/19 1234

## 2019-09-12 NOTE — ED Triage Notes (Signed)
Patient has wart to left little finger.  This has been present for several months and gradually getting larger.  Patient has been using medicated bandaids on site.  Now has white tissue on finger surrounding this bump

## 2019-09-12 NOTE — Discharge Instructions (Signed)
Take antibiotic twice daily with food. Important to keep covered, though allowed to breathe. Very important to call dermatologist for further evaluation/possible removal. May call your primary care to put in a referral if you have difficulty scheduling this appointment on your own.

## 2019-09-21 ENCOUNTER — Encounter: Payer: Self-pay | Admitting: Family Medicine

## 2019-09-21 ENCOUNTER — Other Ambulatory Visit: Payer: Self-pay

## 2019-09-21 ENCOUNTER — Ambulatory Visit (INDEPENDENT_AMBULATORY_CARE_PROVIDER_SITE_OTHER): Payer: Medicaid Other | Admitting: Family Medicine

## 2019-09-21 VITALS — BP 122/82 | HR 70 | Ht 76.0 in | Wt 191.4 lb

## 2019-09-21 DIAGNOSIS — N529 Male erectile dysfunction, unspecified: Secondary | ICD-10-CM

## 2019-09-21 DIAGNOSIS — R2232 Localized swelling, mass and lump, left upper limb: Secondary | ICD-10-CM | POA: Diagnosis not present

## 2019-09-21 NOTE — Progress Notes (Signed)
    CHIEF COMPLAINT / HPI:  wart Has lesion on little finger left hand. Been there 2 years or so. Continues to get bigger. No bleeding. Interfering with activities, especially holding things Otherwise he is doing well   PERTINENT  PMH / PSH: I have reviewed the patient's medications, allergies, past medical and surgical history, smoking status and updated in the EMR as appropriate.   OBJECTIVE:  BP 122/82   Pulse 70   Ht 6\' 4"  (1.93 m)   Wt 191 lb 6.4 oz (86.8 kg)   SpO2 100%   BMI 23.30 kg/m  SKIN; left little finger at PIP joint on volar surface is a fleshy 7 mm X 4 mm round nodule. No unusual erythema. No surrounding skin abnormalities. Sessile. Nontender.  ASSESSMENT / PLAN: Mass on finger I am not  Sure this is a common wart It looks very symmetrical and vascular. It is also placed right over  PIP joint on volar surface. Will refer to dermatology for further evaluation and treatment  No problem-specific Assessment & Plan notes found for this encounter.   MD

## 2019-09-22 NOTE — Assessment & Plan Note (Signed)
He want sto follow back up with urology. His referal was in 2020 so I will make new referral

## 2020-02-29 ENCOUNTER — Ambulatory Visit
Admission: EM | Admit: 2020-02-29 | Discharge: 2020-02-29 | Disposition: A | Discharge status: Home / Self Care | Payer: Medicaid Other | Attending: Emergency Medicine | Admitting: Emergency Medicine

## 2020-02-29 DIAGNOSIS — Z20822 Contact with and (suspected) exposure to covid-19: Secondary | ICD-10-CM | POA: Diagnosis not present

## 2020-02-29 DIAGNOSIS — R0789 Other chest pain: Secondary | ICD-10-CM | POA: Diagnosis not present

## 2020-02-29 MED ORDER — NAPROXEN 500 MG PO TABS: 500.0000 mg | ORAL_TABLET | Freq: Two times a day (BID) | ORAL | 0 refills | Status: DC

## 2020-02-29 NOTE — Discharge Instructions (Addendum)
EKG normal Covid test pending-monitor my chart for results Please use Naprosyn twice daily as needed for chest discomfort and monitor for gradual resolution over the next few days Please follow-up if any symptoms not improving or worsening

## 2020-02-29 NOTE — ED Provider Notes (Signed)
[facility]    CSN: [id number] Arrival date & time: 02/29/20  1126      History   Chief Complaint Chief Complaint  Patient presents with  . Chest Pain    Rib cage area    HPI Bruce Rios is a 24 y.o. male history of asthma presenting today for evaluation of left-sided chest pain.  Patient reports that beginning yesterday he was noticing discomfort in his left lower rib cage that was worse with twisting and turning.  Had discomfort with getting out of bed today.  Slightly eased off from yesterday.  Denies history of hypertension or diabetes.  Denies tobacco use, does admit to smoking marijuana.  Denies cocaine use or other recreational drug use.  Denies leg pain or leg swelling.  Denies prior DVT/PE.  Denies any recent URI symptoms of cough congestion or sore throat.  Denies abdominal pain nausea or vomiting.  Denies correlation to eating.  Has not take any medicines for symptoms.  Denies any shortness of breath or difficulty breathing.  Also requesting Covid testing as he reports to exposures recently.  He has felt slightly fatigued.  HPI  Past Medical History:  Diagnosis Date  . Asthma   . Closed fracture of nasal bone 10/24/2015   10/20/2015: CT BRAIN AND FACIAL BONES WO CONTRAST:  IMPRESSION:  - Suspected very subtle subluxation between the frontal process of the maxilla on the left and the left nasal bone, versus a tiny fracture. Adjacent mild soft tissue swelling. No other fracture demonstrated. - No acute intracranial pathology demonstrated.   . Concussion with loss of consciousness 10/24/2015    Patient Active Problem List   Diagnosis Date Noted  . Erectile dysfunction 04/28/2019  . RHINITIS, ALLERGIC 08/06/2006  . ASTHMA, PERSISTENT, SEVERE 08/06/2006  . ECZEMA, ATOPIC DERMATITIS 08/06/2006    History reviewed. No pertinent surgical history.     Home Medications    Prior to Admission medications   Medication Sig Start Date End Date Taking?  Authorizing Provider  albuterol (PROVENTIL HFA;VENTOLIN HFA) 108 (90 Base) MCG/ACT inhaler Inhale 2 puffs into the lungs every 6 (six) hours as needed for wheezing or shortness of breath. 09/20/18   [name], MD  beclomethasone (QVAR REDIHALER) 80 MCG/ACT inhaler Inhale 1 puff into the lungs 2 (two) times daily. 09/20/18   [name], MD  naproxen (NAPROSYN) 500 MG tablet Take 1 tablet (500 mg total) by mouth 2 (two) times daily. 02/29/20   Isobella Ascher C, PA-C  cetirizine (ZYRTEC) 10 MG tablet Take 1 tablet (10 mg total) by mouth daily. 06/07/18 09/12/19  [name], PA-C    Family History Family History  Problem Relation Age of Onset  . Healthy Mother   . Healthy Father     Social History Social History   Tobacco Use  . Smoking status: Never Smoker  . Smokeless tobacco: Never Used  Vaping Use  . Vaping Use: Never used  Substance Use Topics  . Alcohol use: No    Comment: occ  . Drug use: Not Currently    Types: Marijuana     Allergies   Patient has no known allergies.   Review of Systems Review of Systems  Constitutional: Negative for fatigue and fever.  HENT: Negative for congestion, ear pain and sore throat.   Eyes: Negative for redness, itching and visual disturbance.  Respiratory: Negative for shortness of breath.   Cardiovascular: Positive for chest pain. Negative for leg swelling.  Gastrointestinal: Negative for  nausea and vomiting.  Musculoskeletal: Negative for myalgias.  Skin: Negative for color change, rash and wound.  Neurological: Negative for dizziness, syncope, weakness, light-headedness and headaches.     Physical Exam Triage Vital Signs ED Triage Vitals  Enc Vitals Group     BP 02/29/20 1209 110/77     Pulse Rate 02/29/20 1209 72     Resp 02/29/20 1209 18     Temp 02/29/20 1209 98.9 F (37.2 C)     Temp Source 02/29/20 1209 Oral     SpO2 02/29/20 1209 97 %     Weight --      Height --      Head Circumference --      Peak Flow  --      Pain Score 02/29/20 1208 7     Pain Loc --      Pain Edu? --      Excl. in [place]? --    No data found.  Updated Vital Signs BP 110/77 (BP Location: Left Arm)   Pulse 72   Temp 98.9 F (37.2 C) (Oral)   Resp 18   SpO2 97%   Visual Acuity Right Eye Distance:   Left Eye Distance:   Bilateral Distance:    Right Eye Near:   Left Eye Near:    Bilateral Near:     Physical Exam Vitals and nursing note reviewed.  Constitutional:      Appearance: He is well-developed.     Comments: No acute distress  HENT:     Head: Normocephalic and atraumatic.     Ears:     Comments:      Nose: Nose normal.     Mouth/Throat:     Comments: Oral mucosa pink and moist, no tonsillar enlargement or exudate. Posterior pharynx patent and nonerythematous, no uvula deviation or swelling. Normal phonation. Eyes:     Conjunctiva/sclera: Conjunctivae normal.  Cardiovascular:     Rate and Rhythm: Normal rate and regular rhythm.  Pulmonary:     Effort: Pulmonary effort is normal. No respiratory distress.     Comments: Breathing comfortably at rest, CTABL, no wheezing, rales or other adventitious sounds auscultated   Anterior chest nontender to palpation Abdominal:     General: There is no distension.     Comments: Soft, nondistended, nontender to light deep palpation throughout abdomen  Musculoskeletal:        General: Normal range of motion.     Cervical back: Neck supple.  Skin:    General: Skin is warm and dry.  Neurological:     Mental Status: He is alert and oriented to person, place, and time.      UC Treatments / Results  Labs (all labs ordered are listed, but only abnormal results are displayed) Labs Reviewed  NOVEL CORONAVIRUS, NAA    EKG   Radiology No results found.  Procedures Procedures (including critical care time)  Medications Ordered in UC Medications - No data to display  Initial Impression / Assessment and Plan / UC Course  I have reviewed the triage  vital signs and the nursing notes.  Pertinent labs & imaging results that were available during my care of the patient were reviewed by me and considered in my medical decision making (see chart for details).     1.  Chest pain-atypical, EKG normal sinus rhythm, no acute signs of ischemia or infarction.  Suspect most likely chest wall inflammation/MSK etiology given worse with movement, lungs clear.  No mechanism  of injury.  Recommending anti-inflammatories and monitoring for gradual resolution.  2.  Covid exposure-Covid PCR pending  Discussed strict return precautions. Patient verbalized understanding and is agreeable with plan.  Final Clinical Impressions(s) / UC Diagnoses   Final diagnoses:  Atypical chest pain     Discharge Instructions     EKG normal Covid test pending-monitor my chart for results Please use Naprosyn twice daily as needed for chest discomfort and monitor for gradual resolution over the next few days Please follow-up if any symptoms not improving or worsening     ED Prescriptions    Medication Sig Dispense Auth. Provider   naproxen (NAPROSYN) 500 MG tablet Take 1 tablet (500 mg total) by mouth 2 (two) times daily. 30 tablet Teagan Heidrick, [place] C, PA-C     PDMP not reviewed this encounter.   Lew Dawes, PA-C 02/29/20 1256

## 2020-02-29 NOTE — ED Triage Notes (Signed)
Pt present Rib cage pain, pt states when he twist and turns he is feeling a sharp pain in his rib cage.

## 2020-03-02 LAB — NOVEL CORONAVIRUS, NAA: SARS-CoV-2, NAA: NOT DETECTED

## 2020-03-02 LAB — SARS-COV-2, NAA 2 DAY TAT

## 2020-05-15 ENCOUNTER — Other Ambulatory Visit: Payer: Self-pay

## 2020-05-15 ENCOUNTER — Ambulatory Visit
Admission: EM | Admit: 2020-05-15 | Discharge: 2020-05-15 | Disposition: A | Discharge status: Home / Self Care | Payer: Medicaid Other | Attending: Family Medicine | Admitting: Family Medicine

## 2020-05-15 ENCOUNTER — Encounter: Payer: Self-pay | Admitting: Family Medicine

## 2020-05-15 DIAGNOSIS — J069 Acute upper respiratory infection, unspecified: Secondary | ICD-10-CM

## 2020-05-15 DIAGNOSIS — Z1152 Encounter for screening for COVID-19: Secondary | ICD-10-CM | POA: Diagnosis not present

## 2020-05-15 MED ORDER — HYDROCODONE-HOMATROPINE 5-1.5 MG/5ML PO SYRP: 5.0000 mL | ORAL_SOLUTION | Freq: Four times a day (QID) | ORAL | 0 refills | Status: DC | PRN

## 2020-05-15 NOTE — Discharge Instructions (Addendum)
Strategies to prevent and/or treat COVID-19:  Vitamin D3 5000 IU (125 mcg) daily Vitamin C 500 mg twice daily Zinc 50 to 75 mg daily  Listerine type mouthwash 4 times a day    

## 2020-05-15 NOTE — ED Provider Notes (Signed)
[facility]    CSN: [id number] Arrival date & time: 05/15/20  1014      History   Chief Complaint Chief Complaint  Patient presents with  . Cough  . Sore Throat    HPI Bruce Rios is a 24 y.o. male.   Established [facility] patient  Pt presents to Urgent Care with c/o cough, nasal congestion, and sore throat x approx 3 days.  Pt w/ no known COVID exposure; has not been vaccinated against COVID or flu.   Coughing has become nonstop  No loss of sense of smell  No GI sx         Past Medical History:  Diagnosis Date  . Asthma   . Closed fracture of nasal bone 10/24/2015   10/20/2015: CT BRAIN AND FACIAL BONES WO CONTRAST:  IMPRESSION:  - Suspected very subtle subluxation between the frontal process of the maxilla on the left and the left nasal bone, versus a tiny fracture. Adjacent mild soft tissue swelling. No other fracture demonstrated. - No acute intracranial pathology demonstrated.   . Concussion with loss of consciousness 10/24/2015    Patient Active Problem List   Diagnosis Date Noted  . Erectile dysfunction 04/28/2019  . RHINITIS, ALLERGIC 08/06/2006  . ASTHMA, PERSISTENT, SEVERE 08/06/2006  . ECZEMA, ATOPIC DERMATITIS 08/06/2006    History reviewed. No pertinent surgical history.     Home Medications    Prior to Admission medications   Medication Sig Start Date End Date Taking? Authorizing Provider  ELDERBERRY PO Take 1 capsule by mouth.   Yes [provider]  albuterol (PROVENTIL HFA;VENTOLIN HFA) 108 (90 Base) MCG/ACT inhaler Inhale 2 puffs into the lungs every 6 (six) hours as needed for wheezing or shortness of breath. 09/20/18   [name], MD  beclomethasone (QVAR REDIHALER) 80 MCG/ACT inhaler Inhale 1 puff into the lungs 2 (two) times daily. 09/20/18   [name], MD  HYDROcodone-homatropine (HYDROMET) 5-1.5 MG/5ML syrup Take 5 mLs by mouth every 6 (six) hours as needed for cough. 05/15/20   Elvina Sidle, MD    naproxen (NAPROSYN) 500 MG tablet Take 1 tablet (500 mg total) by mouth 2 (two) times daily. 02/29/20   Wieters, Hallie C, PA-C  cetirizine (ZYRTEC) 10 MG tablet Take 1 tablet (10 mg total) by mouth daily. 06/07/18 09/12/19  [name], PA-C    Family History Family History  Problem Relation Age of Onset  . Healthy Mother   . Healthy Father     Social History Social History   Tobacco Use  . Smoking status: Never Smoker  . Smokeless tobacco: Never Used  Vaping Use  . Vaping Use: Never used  Substance Use Topics  . Alcohol use: No  . Drug use: Yes    Frequency: 5.0 times per week    Types: Marijuana    Comment: daily     Allergies   Patient has no known allergies.   Review of Systems Review of Systems  Constitutional: Negative.   HENT: Positive for congestion.   Respiratory: Positive for cough.      Physical Exam Triage Vital Signs ED Triage Vitals  Enc Vitals Group     BP 05/15/20 1111 122/83     Pulse Rate 05/15/20 1111 79     Resp 05/15/20 1111 18     Temp 05/15/20 1111 97.8 F (36.6 C)     Temp Source 05/15/20 1111 Oral     SpO2 05/15/20 1111 98 %  Weight 05/15/20 1115 200 lb (90.7 kg)     Height 05/15/20 1115 6\' 4"  (1.93 m)     Head Circumference --      Peak Flow --      Pain Score 05/15/20 1114 0     Pain Loc --      Pain Edu? --      Excl. in [place]? --    No data found.  Updated Vital Signs BP 122/83 (BP Location: Left Arm)   Pulse 79   Temp 97.8 F (36.6 C) (Oral)   Resp 18   Ht 6\' 4"  (1.93 m)   Wt 90.7 kg   SpO2 98%   BMI 24.34 kg/m    Physical Exam Vitals and nursing note reviewed.  Constitutional:      Appearance: He is well-developed and normal weight.  HENT:     Head: Normocephalic.     Mouth/Throat:     Mouth: Mucous membranes are moist.     Pharynx: Oropharynx is clear.  Eyes:     Conjunctiva/sclera: Conjunctivae normal.  Cardiovascular:     Rate and Rhythm: Normal rate.     Heart sounds: Normal heart sounds.   Pulmonary:     Effort: Pulmonary effort is normal.     Breath sounds: Normal breath sounds.  Skin:    General: Skin is dry.  Neurological:     General: No focal deficit present.     Mental Status: He is alert.  Psychiatric:        Mood and Affect: Mood normal.      UC Treatments / Results  Labs (all labs ordered are listed, but only abnormal results are displayed) Labs Reviewed  NOVEL CORONAVIRUS, NAA    EKG   Radiology No results found.  Procedures Procedures (including critical care time)  Medications Ordered in UC Medications - No data to display  Initial Impression / Assessment and Plan / UC Course  I have reviewed the triage vital signs and the nursing notes.  Pertinent labs & imaging results that were available during my care of the patient were reviewed by me and considered in my medical decision making (see chart for details).    Final Clinical Impressions(s) / UC Diagnoses   Final diagnoses:  Encounter for screening for COVID-19  Viral URI with cough     Discharge Instructions     Strategies to prevent and/or treat COVID-19:  Vitamin D3 5000 IU (125 mcg) daily Vitamin C 500 mg twice daily Zinc 50 to 75 mg daily    Listerine type mouthwash 4 times a day      ED Prescriptions    Medication Sig Dispense Auth. Provider   HYDROcodone-homatropine (HYDROMET) 5-1.5 MG/5ML syrup Take 5 mLs by mouth every 6 (six) hours as needed for cough. 60 mL 14/07/21, MD     PDMP not reviewed this encounter.   , MD 05/15/20 [id number]

## 2020-05-15 NOTE — ED Triage Notes (Signed)
Pt presents to Urgent Care with c/o cough, nasal congestion, and sore throat x approx 3 days.  Pt w/ no known COVID exposure; has not been vaccinated against COVID or flu.

## 2020-05-16 ENCOUNTER — Emergency Department (HOSPITAL_COMMUNITY)
Admission: EM | Admit: 2020-05-16 | Discharge: 2020-05-16 | Disposition: A | Discharge status: Home / Self Care | Payer: Medicaid Other | Attending: Emergency Medicine | Admitting: Emergency Medicine

## 2020-05-16 ENCOUNTER — Other Ambulatory Visit: Payer: Self-pay

## 2020-05-16 ENCOUNTER — Emergency Department (HOSPITAL_COMMUNITY): Payer: Medicaid Other

## 2020-05-16 DIAGNOSIS — R059 Cough, unspecified: Secondary | ICD-10-CM | POA: Diagnosis not present

## 2020-05-16 DIAGNOSIS — J4551 Severe persistent asthma with (acute) exacerbation: Secondary | ICD-10-CM | POA: Diagnosis not present

## 2020-05-16 DIAGNOSIS — J00 Acute nasopharyngitis [common cold]: Secondary | ICD-10-CM | POA: Insufficient documentation

## 2020-05-16 DIAGNOSIS — Z8616 Personal history of COVID-19: Secondary | ICD-10-CM | POA: Diagnosis not present

## 2020-05-16 DIAGNOSIS — J300 Vasomotor rhinitis: Secondary | ICD-10-CM | POA: Diagnosis not present

## 2020-05-16 DIAGNOSIS — R0602 Shortness of breath: Secondary | ICD-10-CM | POA: Diagnosis not present

## 2020-05-16 DIAGNOSIS — R079 Chest pain, unspecified: Secondary | ICD-10-CM | POA: Diagnosis not present

## 2020-05-16 MED ORDER — BENZONATATE 100 MG PO CAPS: 100.0000 mg | ORAL_CAPSULE | Freq: Three times a day (TID) | ORAL | 0 refills | Status: DC

## 2020-05-16 MED ORDER — GUAIFENESIN 100 MG/5ML PO SYRP: 100.0000 mg | ORAL_SOLUTION | ORAL | 0 refills | Status: DC | PRN

## 2020-05-16 NOTE — Discharge Instructions (Signed)
Please read and follow all provided instructions.  Your diagnoses today include:  1. Acute rhinitis   2. Cough     Tests performed today include:  Chest x-ray - does not show any pneumonia  Vital signs. See below for your results today.   Medications prescribed:   Tessalon Perles - cough suppressant medication  Take any prescribed medications only as directed.  Home care instructions:  Follow any educational materials contained in this packet.  Follow-up instructions: Please follow-up with your primary care provider in the next 3 days for further evaluation of your symptoms and a recheck if you are not feeling better.   Please check on your COVID results in MyChart later today.   Return instructions:   Please return to the Emergency Department if you experience worsening symptoms.  Please return with worsening wheezing, shortness of breath, or difficulty breathing.  Return with persistent fever above 101F.   Please return if you have any other emergent concerns.  Additional Information:  Your vital signs today were: BP (!) 136/94 (BP Location: Right Arm)    Pulse 75    Temp 98.2 F (36.8 C) (Oral)    Resp 16    SpO2 97%  If your blood pressure (BP) was elevated above 135/85 this visit, please have this repeated by your doctor within one month. --------------

## 2020-05-16 NOTE — ED Provider Notes (Signed)
[facility]-EMERGENCY DEPT Provider Note   CSN: [id number] Arrival date & time: 05/16/20  1129     History Chief Complaint  Patient presents with  . Cough  . Insomnia  . Sore Throat    Bruce Rios is a 24 y.o. male.  Patient presents emergency department today for evaluation of cough, worse at night, also nasal congestion and runny nose.  Patient was seen at urgent care yesterday.  Covid testing performed, results not yet available.  Patient states that he picked up some medications from a health food store including cough drops and tea.  He states that this has been helping symptoms.  Family encouraged him to come get rechecked today.  No fever, ear pain, vomiting.  Patient had Covid in January.  No known sick contacts.        Past Medical History:  Diagnosis Date  . Asthma   . Closed fracture of nasal bone 10/24/2015   10/20/2015: CT BRAIN AND FACIAL BONES WO CONTRAST:  IMPRESSION:  - Suspected very subtle subluxation between the frontal process of the maxilla on the left and the left nasal bone, versus a tiny fracture. Adjacent mild soft tissue swelling. No other fracture demonstrated. - No acute intracranial pathology demonstrated.   . Concussion with loss of consciousness 10/24/2015    Patient Active Problem List   Diagnosis Date Noted  . Erectile dysfunction 04/28/2019  . RHINITIS, ALLERGIC 08/06/2006  . ASTHMA, PERSISTENT, SEVERE 08/06/2006  . ECZEMA, ATOPIC DERMATITIS 08/06/2006    No past surgical history on file.     Family History  Problem Relation Age of Onset  . Healthy Mother   . Healthy Father     Social History   Tobacco Use  . Smoking status: Never Smoker  . Smokeless tobacco: Never Used  Vaping Use  . Vaping Use: Never used  Substance Use Topics  . Alcohol use: No  . Drug use: Yes    Frequency: 5.0 times per week    Types: Marijuana    Comment: daily    Home Medications Prior to Admission medications   Medication  Sig Start Date End Date Taking? Authorizing Provider  albuterol (PROVENTIL HFA;VENTOLIN HFA) 108 (90 Base) MCG/ACT inhaler Inhale 2 puffs into the lungs every 6 (six) hours as needed for wheezing or shortness of breath. 09/20/18   [name], MD  beclomethasone (QVAR REDIHALER) 80 MCG/ACT inhaler Inhale 1 puff into the lungs 2 (two) times daily. 09/20/18   [name], MD  ELDERBERRY PO Take 1 capsule by mouth.    [provider]  HYDROcodone-homatropine (HYDROMET) 5-1.5 MG/5ML syrup Take 5 mLs by mouth every 6 (six) hours as needed for cough. 05/15/20   Elvina Sidle, MD  naproxen (NAPROSYN) 500 MG tablet Take 1 tablet (500 mg total) by mouth 2 (two) times daily. 02/29/20   Wieters, Hallie C, PA-C  cetirizine (ZYRTEC) 10 MG tablet Take 1 tablet (10 mg total) by mouth daily. 06/07/18 09/12/19  [name], PA-C    Allergies    Patient has no known allergies.  Review of Systems   Review of Systems  Constitutional: Negative for chills, fatigue and fever.  HENT: Positive for congestion, rhinorrhea and sore throat. Negative for ear pain and sinus pressure.   Eyes: Negative for redness.  Respiratory: Positive for cough. Negative for wheezing.   Gastrointestinal: Negative for abdominal pain, diarrhea, nausea and vomiting.  Genitourinary: Negative for dysuria.  Musculoskeletal: Negative for myalgias and neck stiffness.  Skin: Negative for rash.  Neurological: Negative for headaches.  Hematological: Negative for adenopathy.    Physical Exam Updated Vital Signs BP (!) 136/94 (BP Location: Right Arm)   Pulse 75   Temp 98.2 F (36.8 C) (Oral)   Resp 16   SpO2 97%   Physical Exam Vitals and nursing note reviewed.  Constitutional:      Appearance: He is well-developed.  HENT:     Head: Normocephalic and atraumatic.     Jaw: No trismus.     Right Ear: Tympanic membrane, ear canal and external ear normal.     Left Ear: Tympanic membrane, ear canal and external ear normal.      Nose: Nose normal. No mucosal edema or rhinorrhea.     Mouth/Throat:     Mouth: Mucous membranes are not dry.     Pharynx: Uvula midline. Posterior oropharyngeal erythema (Mild) present. No oropharyngeal exudate or uvula swelling.     Tonsils: No tonsillar abscesses.     Comments: White patches on tongue Eyes:     General:        Right eye: No discharge.        Left eye: No discharge.     Conjunctiva/sclera: Conjunctivae normal.  Cardiovascular:     Rate and Rhythm: Normal rate and regular rhythm.     Heart sounds: Normal heart sounds.  Pulmonary:     Effort: Pulmonary effort is normal. No respiratory distress.     Breath sounds: Normal breath sounds. No wheezing or rales.  Abdominal:     Palpations: Abdomen is soft.     Tenderness: There is no abdominal tenderness.  Musculoskeletal:     Cervical back: Normal range of motion and neck supple.  Skin:    General: Skin is warm and dry.  Neurological:     Mental Status: He is alert.     ED Results / Procedures / Treatments   Labs (all labs ordered are listed, but only abnormal results are displayed) Labs Reviewed - No data to display  EKG None  Radiology No results found.  Procedures Procedures (including critical care time)  Medications Ordered in ED Medications - No data to display  ED Course  I have reviewed the triage vital signs and the nursing notes.  Pertinent labs & imaging results that were available during my care of the patient were reviewed by me and considered in my medical decision making (see chart for details).  Patient seen and examined. X-ray performed and reviewed personally. Plan d/c with robitussin, tessalon if needed -- otherwise continued conservative care.  Encouraged to follow-up on COVID testing.    Vital signs reviewed and are as follows: BP (!) 136/94 (BP Location: Right Arm)   Pulse 75   Temp 98.2 F (36.8 C) (Oral)   Resp 16   SpO2 97%   Patient counseled on supportive care  for viral URI and s/s to return including worsening symptoms, persistent fever, persistent vomiting, or if they have any other concerns. Urged to see PCP if symptoms persist for more than 3 days. Patient verbalizes understanding and agrees with plan.      MDM Rules/Calculators/A&P                          Patient with symptoms consistent with a viral syndrome or allergic rhinitis. Vitals are stable, no fever. No signs of dehydration. Lung exam normal, no signs of pneumonia. Supportive therapy indicated with return if  symptoms worsen.    Final Clinical Impression(s) / ED Diagnoses Final diagnoses:  Acute rhinitis  Cough    Rx / DC Orders ED Discharge Orders         Ordered    benzonatate (TESSALON) 100 MG capsule  Every 8 hours        05/16/20 1346    guaifenesin (ROBITUSSIN) 100 MG/5ML syrup  Every 4 hours PRN        05/16/20 1346           Renne Crigler, PA-C 05/16/20 1353    Bethann Berkshire, MD 05/18/20 1029

## 2020-05-16 NOTE — ED Triage Notes (Signed)
Pt POV c/o "just feeling bad" - cough x3 days, difficulty going to sleep.  C/o chest pain with cough, nasal congestion. Denies fever.  Reports ShOB at night.

## 2020-05-17 ENCOUNTER — Telehealth: Payer: Self-pay

## 2020-05-17 LAB — SARS-COV-2, NAA 2 DAY TAT

## 2020-05-17 LAB — NOVEL CORONAVIRUS, NAA: SARS-CoV-2, NAA: NOT DETECTED

## 2020-05-17 NOTE — Telephone Encounter (Signed)
Transition Care Management Follow-up Telephone Call  Date of discharge and from where: 05/16/2020 [facility]  How have you been since you were released from the hospital? Doing alright.  Any questions or concerns? No  Items Reviewed:  Did the pt receive and understand the discharge instructions provided? Yes   Medications obtained and verified? No, will pick up later today.   Other? No   Any new allergies since your discharge? No   Dietary orders reviewed? Yes  Do you have support at home? Yes    Functional Questionnaire: (I = Independent and D = Dependent) ADLs: I  Bathing/Dressing- I  Meal Prep- I  Eating- I  Maintaining continence- I  Transferring/Ambulation- I  Managing Meds- I  Follow up appointments reviewed:   PCP Hospital f/u appt confirmed? No  Will follow up with PCP if not feeling better after he starts cough medication.  Specialist Hospital f/u appt confirmed? No    Are transportation arrangements needed? No   If their condition worsens, is the pt aware to call PCP or go to the Emergency Dept.? Yes  Was the patient provided with contact information for the PCP's office or ED? Yes  Was to pt encouraged to call back with questions or concerns? Yes

## 2020-06-16 ENCOUNTER — Ambulatory Visit
Admission: EM | Admit: 2020-06-16 | Discharge: 2020-06-16 | Disposition: A | Discharge status: Home / Self Care | Payer: Medicaid Other | Attending: Emergency Medicine | Admitting: Emergency Medicine

## 2020-06-16 ENCOUNTER — Encounter: Payer: Self-pay | Admitting: Emergency Medicine

## 2020-06-16 ENCOUNTER — Other Ambulatory Visit: Payer: Self-pay

## 2020-06-16 DIAGNOSIS — Z20822 Contact with and (suspected) exposure to covid-19: Secondary | ICD-10-CM | POA: Diagnosis not present

## 2020-06-16 MED ORDER — PSEUDOEPH-BROMPHEN-DM 30-2-10 MG/5ML PO SYRP: 10.0000 mL | ORAL_SOLUTION | Freq: Four times a day (QID) | ORAL | 0 refills | Status: DC | PRN

## 2020-06-16 MED ORDER — IBUPROFEN 800 MG PO TABS: 800.0000 mg | ORAL_TABLET | Freq: Three times a day (TID) | ORAL | 0 refills | Status: DC

## 2020-06-16 NOTE — Discharge Instructions (Addendum)
Covid test pending, monitor my chart for results Ibuprofen and Tylenol to help with fevers body aches headaches Rest and fluids May use cough syrup as needed for cough and congestion Follow-up if not improving or worsening
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
## 2020-06-16 NOTE — ED Triage Notes (Signed)
Pt here for fever and body aches x 2 days; pt sts grandmother is positive for covid

## 2020-06-20 LAB — NOVEL CORONAVIRUS, NAA

## 2020-07-25 ENCOUNTER — Encounter: Payer: Self-pay | Admitting: Family Medicine

## 2020-07-25 ENCOUNTER — Ambulatory Visit (INDEPENDENT_AMBULATORY_CARE_PROVIDER_SITE_OTHER): Payer: Medicaid Other | Admitting: Family Medicine

## 2020-07-25 ENCOUNTER — Other Ambulatory Visit: Payer: Self-pay

## 2020-07-25 DIAGNOSIS — Z1159 Encounter for screening for other viral diseases: Secondary | ICD-10-CM

## 2020-07-25 DIAGNOSIS — N529 Male erectile dysfunction, unspecified: Secondary | ICD-10-CM

## 2020-07-25 MED ORDER — SILDENAFIL CITRATE 25 MG PO TABS: 25.0000 mg | ORAL_TABLET | Freq: Every day | ORAL | 0 refills | Status: AC | PRN

## 2020-07-25 NOTE — Patient Instructions (Signed)
Thank you for coming to see me today. It was a pleasure. Today we discussed starting you on viagra for ED.you can take this 1 hr before sex.  Please follow-up with PCP as needed  If you have any questions or concerns, please do not hesitate to call the office at [phone].  We will get some labs today.  If they are abnormal or we need to do something about them, I will call you.  If they are normal, I will send you a message on MyChart (if it is active) or a letter in the mail.  If you don't hear from [place] in 2 weeks, please call the office at the number below.  Best wishes,   Dr Allena Katz    Sildenafil tablets (Erectile Dysfunction) What is this medicine? SILDENAFIL (sil DEN a fil) is used to treat erection problems in men. This medicine may be used for other purposes; ask your health care provider or pharmacist if you have questions. COMMON BRAND NAME(S): Viagra What should I tell my health care provider before I take this medicine? They need to know if you have any of these conditions:  bleeding disorders  eye or vision problems, including a rare inherited eye disease called retinitis pigmentosa  anatomical deformation of the penis, Peyronie's disease, or history of priapism (painful and prolonged erection)  heart disease, angina, a history of heart attack, irregular heart beats, or other heart problems  high or low blood pressure  history of blood diseases, like sickle cell anemia or leukemia  history of stomach bleeding  kidney disease  liver disease  stroke  an unusual or allergic reaction to sildenafil, other medicines, foods, dyes, or preservatives  pregnant or trying to get pregnant  breast-feeding How should I use this medicine? Take this medicine by mouth with a glass of water. Follow the directions on the prescription label. The dose is usually taken 1 hour before sexual activity. You should not take the dose more than once per day. Do not take your medicine  more often than directed. Talk to your pediatrician regarding the use of this medicine in children. This medicine is not used in children for this condition. Overdosage: If you think you have taken too much of this medicine contact a poison control center or emergency room at once. NOTE: This medicine is only for you. Do not share this medicine with others. What if I miss a dose? This does not apply. Do not take double or extra doses. What may interact with this medicine? Do not take this medicine with any of the following medications:  cisapride  nitrates like amyl nitrite, isosorbide dinitrate, isosorbide mononitrate, nitroglycerin  riociguat This medicine may also interact with the following medications:  antiviral medicines for HIV or AIDS  bosentan  certain medicines for benign prostatic hyperplasia (BPH)  certain medicines for blood pressure  certain medicines for fungal infections like ketoconazole and itraconazole  cimetidine  erythromycin  rifampin This list may not describe all possible interactions. Give your health care provider a list of all the medicines, herbs, non-prescription drugs, or dietary supplements you use. Also tell them if you smoke, drink alcohol, or use illegal drugs. Some items may interact with your medicine. What should I watch for while using this medicine? If you notice any changes in your vision while taking this drug, call your doctor or health care professional as soon as possible. Stop using this medicine and call your health care provider right away if you have  a loss of sight in one or both eyes. Contact your doctor or health care professional right away if you have an erection that lasts longer than 4 hours or if it becomes painful. This may be a sign of a serious problem and must be treated right away to prevent permanent damage. If you experience symptoms of nausea, dizziness, chest pain or arm pain upon initiation of sexual activity after  taking this medicine, you should refrain from further activity and call your doctor or health care professional as soon as possible. Do not drink alcohol to excess (examples, 5 glasses of wine or 5 shots of whiskey) when taking this medicine. When taken in excess, alcohol can increase your chances of getting a headache or getting dizzy, increasing your heart rate or lowering your blood pressure. Using this medicine does not protect you or your partner against HIV infection (the virus that causes AIDS) or other sexually transmitted diseases. What side effects may I notice from receiving this medicine? Side effects that you should report to your doctor or health care professional as soon as possible:  allergic reactions like skin rash, itching or hives, swelling of the face, lips, or tongue  breathing problems  changes in hearing  changes in vision  chest pain  fast, irregular heartbeat  prolonged or painful erection  seizures Side effects that usually do not require medical attention (report to your doctor or health care professional if they continue or are bothersome):  back pain  dizziness  flushing  headache  indigestion  muscle aches  nausea  stuffy or runny nose This list may not describe all possible side effects. Call your doctor for medical advice about side effects. You may report side effects to FDA at 1-800-FDA-1088. Where should I keep my medicine? Keep out of reach of children. Store at room temperature between 15 and 30 degrees C (59 and 86 degrees F). Throw away any unused medicine after the expiration date. NOTE: This sheet is a summary. It may not cover all possible information. If you have questions about this medicine, talk to your doctor, pharmacist, or health care provider.  2021 Elsevier/Gold Standard (2015-05-09 12:00:25)

## 2020-07-25 NOTE — Assessment & Plan Note (Signed)
Obtained Hep c and HIV antibody levels today.

## 2020-07-25 NOTE — Progress Notes (Signed)
     SUBJECTIVE:   CHIEF COMPLAINT / HPI:   Bruce Rios is a 25 y.o. male presents for follow up   Erectile dysfunction Pt is currently stressed about "current lifestyle", unemployed and cannot get a job due to court dates coming up.  This has been ongoing for the last 1-2 years.  The stress has affected his sex life. He is usually sexually active with multiple sexual partners, uses condoms but is unable to have a sustained erection.  He does get morning erections but less often compared to before.  Denies discharge from penis or dysuria. Has one lesion on left testicle which came up today.   PERTINENT  PMH / PSH: eczema, allergic rhinitis, asthma   OBJECTIVE:   BP 100/60   Pulse 73   Ht 6' 2.53" (1.893 m)   Wt 197 lb 6 oz (89.5 kg)   SpO2 99%   BMI 24.98 kg/m    General: Alert, no acute distress Cardio: Normal S1 and S2, RRR, no r/m/g Pulm: CTAB, normal work of breathing Abdomen: Bowel sounds normal. Abdomen soft and non-tender.  Extremities: No peripheral edema.  Neuro: Cranial nerves grossly intact   Declined genital exam today  ASSESSMENT/PLAN:   Screening for viral disease Obtained Hep c and HIV antibody levels today.   Erectile dysfunction ED likely secondary to recent life stressors. Discussed starting medications such as sildenafil and patient is happy to start it today.  No cardiac risk factors or cardiac history.  His recent life stressors which are likely contributing to the ED. Recommending working on these and also following up with his PCP.   Patient declined Covid vaccine today.  Towanda Octave, MD PGY-2 [facility]

## 2020-07-26 ENCOUNTER — Encounter: Payer: Self-pay | Admitting: Family Medicine

## 2020-07-26 LAB — HIV ANTIBODY (ROUTINE TESTING W REFLEX): HIV Screen 4th Generation wRfx: NONREACTIVE

## 2020-07-26 LAB — HEPATITIS C ANTIBODY: Hep C Virus Ab: <0.1 s/co ratio (ref 0.0–0.9)

## 2020-07-26 NOTE — Assessment & Plan Note (Signed)
ED likely secondary to recent life stressors. Discussed starting medications such as sildenafil and patient is happy to start it today.  No cardiac risk factors or cardiac history.  His recent life stressors which are likely contributing to the ED. Recommending working on these and also following up with his PCP.

## 2020-08-29 ENCOUNTER — Ambulatory Visit: Payer: Medicaid Other | Admitting: Family Medicine

## 2021-03-07 ENCOUNTER — Encounter (HOSPITAL_COMMUNITY): Payer: Self-pay

## 2021-03-07 ENCOUNTER — Other Ambulatory Visit: Payer: Self-pay

## 2021-03-07 ENCOUNTER — Emergency Department (HOSPITAL_COMMUNITY)
Admission: EM | Admit: 2021-03-07 | Discharge: 2021-03-07 | Disposition: A | Discharge status: Home / Self Care | Payer: No Typology Code available for payment source | Attending: Emergency Medicine | Admitting: Emergency Medicine

## 2021-03-07 ENCOUNTER — Emergency Department (HOSPITAL_COMMUNITY): Payer: No Typology Code available for payment source

## 2021-03-07 DIAGNOSIS — J4550 Severe persistent asthma, uncomplicated: Secondary | ICD-10-CM | POA: Insufficient documentation

## 2021-03-07 DIAGNOSIS — M5441 Lumbago with sciatica, right side: Secondary | ICD-10-CM | POA: Diagnosis not present

## 2021-03-07 DIAGNOSIS — M79661 Pain in right lower leg: Secondary | ICD-10-CM | POA: Diagnosis not present

## 2021-03-07 DIAGNOSIS — M545 Low back pain, unspecified: Secondary | ICD-10-CM | POA: Insufficient documentation

## 2021-03-07 DIAGNOSIS — M25551 Pain in right hip: Secondary | ICD-10-CM | POA: Insufficient documentation

## 2021-03-07 DIAGNOSIS — Y92410 Unspecified street and highway as the place of occurrence of the external cause: Secondary | ICD-10-CM | POA: Diagnosis not present

## 2021-03-07 DIAGNOSIS — M25561 Pain in right knee: Secondary | ICD-10-CM | POA: Insufficient documentation

## 2021-03-07 DIAGNOSIS — M546 Pain in thoracic spine: Secondary | ICD-10-CM | POA: Diagnosis not present

## 2021-03-07 DIAGNOSIS — Z7951 Long term (current) use of inhaled steroids: Secondary | ICD-10-CM | POA: Diagnosis not present

## 2021-03-07 DIAGNOSIS — M79604 Pain in right leg: Secondary | ICD-10-CM | POA: Diagnosis not present

## 2021-03-07 MED ORDER — IBUPROFEN 200 MG PO TABS
600.0000 mg | ORAL_TABLET | Freq: Once | ORAL | Status: AC
  Administered 2021-03-07: 600 mg via ORAL
  Filled 2021-03-07: qty 3
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
## 2021-03-07 NOTE — ED Notes (Signed)
An After Visit Summary was printed and given to the patient. Discharge instructions given and no further questions at this time.  

## 2021-03-08 ENCOUNTER — Telehealth: Payer: Self-pay | Admitting: *Deleted

## 2021-03-08 NOTE — Telephone Encounter (Signed)
Transition Care Management Unsuccessful Follow-up Telephone Call  Date of discharge and from where:  03/07/2021 - [facility] ED  Attempts:  1st Attempt  Reason for unsuccessful TCM follow-up call:  Voice mail full

## 2021-03-11 NOTE — Telephone Encounter (Signed)
Transition Care Management Unsuccessful Follow-up Telephone Call  Date of discharge and from where:  03/07/21 from [facility] ED  Attempts:  2nd Attempt  Reason for unsuccessful TCM follow-up call:  Unable to leave message

## 2021-03-12 NOTE — Telephone Encounter (Signed)
Transition Care Management Follow-up Telephone Call Date of discharge and from where: 03/07/2021 from [facility] ED How have you been since you were released from the hospital? Pt stated that he is still experiencing pain..  Any questions or concerns? No  Items Reviewed: Did the pt receive and understand the discharge instructions provided? Yes  Medications obtained and verified? Yes  Other? No  Any new allergies since your discharge? No  Dietary orders reviewed? No Do you have support at home? Yes   Functional Questionnaire: (I = Independent and D = Dependent) ADLs: I  Bathing/Dressing- I  Meal Prep- I  Eating- I  Maintaining continence- I  Transferring/Ambulation- I  Managing Meds- I  Follow up appointments reviewed:  PCP Hospital f/u appt confirmed? No  Specialist Hospital f/u appt confirmed? No   Are transportation arrangements needed? No  If their condition worsens, is the pt aware to call PCP or go to the Emergency Dept.? Yes Was the patient provided with contact information for the PCP's office or ED? Yes Was to pt encouraged to call back with questions or concerns? Yes

## 2021-08-26 ENCOUNTER — Other Ambulatory Visit: Payer: Self-pay

## 2021-08-26 ENCOUNTER — Encounter: Payer: Self-pay | Admitting: Emergency Medicine

## 2021-08-26 ENCOUNTER — Ambulatory Visit
Admission: EM | Admit: 2021-08-26 | Discharge: 2021-08-26 | Disposition: A | Discharge status: Home / Self Care | Payer: Medicaid Other | Attending: Internal Medicine | Admitting: Internal Medicine

## 2021-08-26 DIAGNOSIS — H6591 Unspecified nonsuppurative otitis media, right ear: Secondary | ICD-10-CM | POA: Diagnosis not present

## 2021-08-26 DIAGNOSIS — M79604 Pain in right leg: Secondary | ICD-10-CM

## 2021-08-26 MED ORDER — PREDNISONE 20 MG PO TABS: 40.0000 mg | ORAL_TABLET | Freq: Every day | ORAL | 0 refills | Status: AC

## 2021-08-26 NOTE — ED Triage Notes (Signed)
Patient c/o right ear pain x 1 day.  Also having right sided groin discomfort.  No apparent injury, no urinary sx's.  The groin discomfort is radiating up right leg. ?
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
## 2021-08-26 NOTE — Discharge Instructions (Signed)
It appears that you have fluid in your eardrum.  You also have a muscle strain in your upper leg.  Prednisone has been prescribed to help treat both of these things.  Alternate ice and heat application to affected area.  Follow-up if symptoms persist or worsen. ?

## 2021-11-03 ENCOUNTER — Ambulatory Visit: Payer: Medicaid Other

## 2021-11-03 ENCOUNTER — Ambulatory Visit (INDEPENDENT_AMBULATORY_CARE_PROVIDER_SITE_OTHER): Payer: Medicaid Other

## 2021-11-03 ENCOUNTER — Ambulatory Visit
Admission: EM | Admit: 2021-11-03 | Discharge: 2021-11-03 | Disposition: A | Discharge status: Home / Self Care | Payer: Medicaid Other | Attending: Emergency Medicine | Admitting: Emergency Medicine

## 2021-11-03 DIAGNOSIS — M25371 Other instability, right ankle: Secondary | ICD-10-CM | POA: Diagnosis not present

## 2021-11-03 DIAGNOSIS — W19XXXA Unspecified fall, initial encounter: Secondary | ICD-10-CM

## 2021-11-03 DIAGNOSIS — S86911A Strain of unspecified muscle(s) and tendon(s) at lower leg level, right leg, initial encounter: Secondary | ICD-10-CM

## 2021-11-03 DIAGNOSIS — M79661 Pain in right lower leg: Secondary | ICD-10-CM

## 2021-11-03 NOTE — ED Provider Notes (Signed)
[facility]    CSN: [id number] Arrival date & time: 11/03/21  1302    HISTORY   Chief Complaint  Patient presents with   Fall   Leg Pain   HPI Bruce Rios is a 26 y.o. male. Patient states that he was at the gym yesterday playing basketball.  Patient states he came up to get somebody, both are trying to get the ball.  Patient states one person went behind him and and destabilized his left lower leg which caused him to fall to the right.  Patient states he is not sure how he landed but noticed he had an immediate acute onset of pain in his left calf.  Patient states he continues to have pain in the left calf when he twists his knee a certain way and also when he attempts to flex his right ankle.  The history is provided by the patient.  Past Medical History:  Diagnosis Date   Asthma    Closed fracture of nasal bone 10/24/2015   10/20/2015: CT BRAIN AND FACIAL BONES WO CONTRAST:  IMPRESSION:  - Suspected very subtle subluxation between the frontal process of the maxilla on the left and the left nasal bone, versus a tiny fracture. Adjacent mild soft tissue swelling. No other fracture demonstrated. - No acute intracranial pathology demonstrated.    Concussion with loss of consciousness 10/24/2015   Patient Active Problem List   Diagnosis Date Noted   Screening for viral disease 07/25/2020   Erectile dysfunction 04/28/2019   RHINITIS, ALLERGIC 08/06/2006   ASTHMA, PERSISTENT, SEVERE 08/06/2006   ECZEMA, ATOPIC DERMATITIS 08/06/2006   History reviewed. No pertinent surgical history.  Home Medications    Prior to Admission medications   Medication Sig Start Date End Date Taking? Authorizing Provider  albuterol (PROVENTIL HFA;VENTOLIN HFA) 108 (90 Base) MCG/ACT inhaler Inhale 2 puffs into the lungs every 6 (six) hours as needed for wheezing or shortness of breath. 09/20/18   Martyn Malay, MD  beclomethasone (QVAR REDIHALER) 80 MCG/ACT inhaler Inhale 1 puff into the lungs  2 (two) times daily. 09/20/18   Martyn Malay, MD  benzonatate (TESSALON) 100 MG capsule Take 1 capsule (100 mg total) by mouth every 8 (eight) hours. Patient not taking: Reported on 06/16/2020 05/16/20   Carlisle Cater, PA-C  brompheniramine-pseudoephedrine-DM 30-2-10 MG/5ML syrup Take 10 mLs by mouth 4 (four) times daily as needed. 06/16/20   Wieters, Hallie C, PA-C  ELDERBERRY PO Take 1 capsule by mouth.    [provider]  guaifenesin (ROBITUSSIN) 100 MG/5ML syrup Take 5-10 mLs (100-200 mg total) by mouth every 4 (four) hours as needed for cough. 05/16/20   Carlisle Cater, PA-C  ibuprofen (ADVIL) 800 MG tablet Take 1 tablet (800 mg total) by mouth 3 (three) times daily. 06/16/20   Wieters, Hallie C, PA-C  sildenafil (VIAGRA) 25 MG tablet Take 1 tablet (25 mg total) by mouth daily as needed for erectile dysfunction. 07/25/20 08/24/20  Lattie Haw, MD  cetirizine (ZYRTEC) 10 MG tablet Take 1 tablet (10 mg total) by mouth daily. 06/07/18 09/12/19  Jaynee Eagles, PA-C    Family History Family History  Problem Relation Age of Onset   Healthy Mother    Healthy Father    Social History Social History   Tobacco Use   Smoking status: Never   Smokeless tobacco: Never  Vaping Use   Vaping Use: Never used  Substance Use Topics   Alcohol use: No   Drug use: Yes  Frequency: 5.0 times per week    Types: Marijuana    Comment: daily   Allergies   Patient has no known allergies.  Review of Systems Review of Systems Pertinent findings noted in history of present illness.   Physical Exam Triage Vital Signs ED Triage Vitals  Enc Vitals Group     BP 04/05/21 0827 (!) 147/82     Pulse Rate 04/05/21 0827 72     Resp 04/05/21 0827 18     Temp 04/05/21 0827 98.3 F (36.8 C)     Temp Source 04/05/21 0827 Oral     SpO2 04/05/21 0827 98 %     Weight --      Height --      Head Circumference --      Peak Flow --      Pain Score 04/05/21 0826 5     Pain Loc --      Pain Edu? --       Excl. in GC? --   No data found.  Updated Vital Signs BP 118/73 (BP Location: Right Arm)   Pulse 68   Temp 99.1 F (37.3 C) (Oral)   Resp 18   SpO2 95%   Physical Exam Vitals and nursing note reviewed.  Constitutional:      General: He is not in acute distress.    Appearance: Normal appearance. He is not ill-appearing.  HENT:     Head: Normocephalic and atraumatic.  Eyes:     General: Lids are normal.        Right eye: No discharge.        Left eye: No discharge.     Extraocular Movements: Extraocular movements intact.     Conjunctiva/sclera: Conjunctivae normal.     Right eye: Right conjunctiva is not injected.     Left eye: Left conjunctiva is not injected.  Neck:     Trachea: Trachea and phonation normal.  Cardiovascular:     Rate and Rhythm: Normal rate and regular rhythm.     Pulses: Normal pulses.     Heart sounds: Normal heart sounds. No murmur heard.   No friction rub. No gallop.  Pulmonary:     Effort: Pulmonary effort is normal. No accessory muscle usage, prolonged expiration or respiratory distress.     Breath sounds: Normal breath sounds. No stridor, decreased air movement or transmitted upper airway sounds. No decreased breath sounds, wheezing, rhonchi or rales.  Chest:     Chest wall: No tenderness.  Musculoskeletal:        General: Tenderness (Right gastrocnemius medial head) present. Normal range of motion.     Cervical back: Normal range of motion and neck supple. Normal range of motion.  Lymphadenopathy:     Cervical: No cervical adenopathy.  Skin:    General: Skin is warm and dry.     Findings: No erythema or rash.  Neurological:     General: No focal deficit present.     Mental Status: He is alert and oriented to person, place, and time.  Psychiatric:        Mood and Affect: Mood normal.        Behavior: Behavior normal.    Visual Acuity Right Eye Distance:   Left Eye Distance:   Bilateral Distance:    Right Eye Near:   Left Eye Near:     Bilateral Near:     UC Couse / Diagnostics / Procedures:    EKG  Radiology DG Tibia/Fibula Right  Result Date: 11/03/2021 CLINICAL DATA:  Right calf pain. EXAM: RIGHT TIBIA AND FIBULA - 2 VIEW COMPARISON:  Right knee radiographs 03/07/2021 FINDINGS: Normal bone mineralization. No knee joint effusion. The ankle mortise is symmetric and intact. Mild-to-moderate distal anterior tibial plafond degenerative spurring. IMPRESSION: 1. No acute fracture. 2. Mild-to-moderate distal anterior tibial plafond degenerative spurring at the ankle. Electronically Signed   By: Yvonne Kendall M.D.   On: 11/03/2021 13:56    Procedures Procedures (including critical care time)  UC Diagnoses / Final Clinical Impressions(s)   I have reviewed the triage vital signs and the nursing notes.  Pertinent labs & imaging results that were available during my care of the patient were reviewed by me and considered in my medical decision making (see chart for details).    Final diagnoses:  Right ankle instability  Muscle strain of right lower leg, initial encounter   X-ray is unremarkable for any acute bony findings.  Patient likely has muscle strain of right gastrocnemius, patient advised to rest, apply ice and take ibuprofen as needed.  ED Prescriptions   None    PDMP not reviewed this encounter.  Pending results:  Labs Reviewed - No data to display  Medications Ordered in UC: Medications - No data to display  Disposition Upon Discharge:  Condition: stable for discharge home Home: take medications as prescribed; routine discharge instructions as discussed; follow up as advised.  Patient presented with an acute illness with associated systemic symptoms and significant discomfort requiring urgent management. In my opinion, this is a condition that a prudent lay person (someone who possesses an average knowledge of health and medicine) may potentially expect to result in complications if not addressed urgently  such as respiratory distress, impairment of bodily function or dysfunction of bodily organs.   Routine symptom specific, illness specific and/or disease specific instructions were discussed with the patient and/or caregiver at length.   As such, the patient has been evaluated and assessed, work-up was performed and treatment was provided in alignment with urgent care protocols and evidence based medicine.  Patient/parent/caregiver has been advised that the patient may require follow up for further testing and treatment if the symptoms continue in spite of treatment, as clinically indicated and appropriate.  Patient/parent/caregiver has been advised to return to the [facility] or PCP if no better; to PCP or the Emergency Department if new signs and symptoms develop, or if the current signs or symptoms continue to change or worsen for further workup, evaluation and treatment as clinically indicated and appropriate  The patient will follow up with their current PCP if and as advised. If the patient does not currently have a PCP we will assist them in obtaining one.   The patient may need specialty follow up if the symptoms continue, in spite of conservative treatment and management, for further workup, evaluation, consultation and treatment as clinically indicated and appropriate.   Patient/parent/caregiver verbalized understanding and agreement of plan as discussed.  All questions were addressed during visit.  Please see discharge instructions below for further details of plan.  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  I recommend that you continue  using your crutches to get around until your right calf is feeling completely better.  We do not have any significant improvement of your pain by Wednesday or Thursday of this week, I recommend that you follow-up with [facility] for further evaluation.  Thank you for visiting urgent care today.      This office note has been dictated using Museum/gallery curator.  Unfortunately, and despite my best efforts, this method of dictation can sometimes lead to occasional typographical or grammatical errors.  I apologize in advance if this occurs.     Lynden Oxford Scales, [place] 11/04/21 [phone]

## 2021-11-03 NOTE — ED Triage Notes (Signed)
Pt presents with right calf pain after a fall at the gym yesterday.

## 2021-11-03 NOTE — Discharge Instructions (Signed)
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  I recommend that you continue using your crutches to get around until your right calf is feeling completely better.  We do not have any significant improvement of your pain by Wednesday or Thursday of this week, I recommend that you follow-up with [facility] for further evaluation.  Thank you for visiting urgent care today.

## 2021-11-04 ENCOUNTER — Emergency Department (HOSPITAL_COMMUNITY)
Admission: EM | Admit: 2021-11-04 | Discharge: 2021-11-04 | Disposition: A | Discharge status: Home / Self Care | Payer: Medicaid Other | Attending: Emergency Medicine | Admitting: Emergency Medicine

## 2021-11-04 ENCOUNTER — Encounter (HOSPITAL_COMMUNITY): Payer: Self-pay

## 2021-11-04 ENCOUNTER — Other Ambulatory Visit: Payer: Self-pay

## 2021-11-04 DIAGNOSIS — J45909 Unspecified asthma, uncomplicated: Secondary | ICD-10-CM | POA: Insufficient documentation

## 2021-11-04 DIAGNOSIS — M79661 Pain in right lower leg: Secondary | ICD-10-CM

## 2021-11-04 DIAGNOSIS — Z7951 Long term (current) use of inhaled steroids: Secondary | ICD-10-CM | POA: Insufficient documentation

## 2021-11-04 DIAGNOSIS — M79604 Pain in right leg: Secondary | ICD-10-CM | POA: Insufficient documentation

## 2021-11-04 MED ORDER — KETOROLAC TROMETHAMINE 60 MG/2ML IM SOLN
30.0000 mg | Freq: Once | INTRAMUSCULAR | Status: AC
  Administered 2021-11-04: 30 mg via INTRAMUSCULAR
  Filled 2021-11-04: qty 2

## 2021-11-04 NOTE — ED Triage Notes (Signed)
Patient c/o right calf pain since he had a fall at the gym x 2 days. Patient went to an UC yesterday.

## 2021-11-04 NOTE — ED Provider Notes (Signed)
[facility]-EMERGENCY DEPT Provider Note   CSN: [id number] Arrival date & time: 11/04/21  0857     History  Chief Complaint  Patient presents with   calf pain    Bruce Rios is a 26 y.o. male.  HPI Patient for right calf pain.  Medical history includes asthma.  3 days ago, he sustained a right calf injury while playing basketball.  He describes the episode going for the ball and undergoing rapid start/stop movements.  He experienced immediate right calf pain which caused him to fall to the ground.  He has since had pain in the area of his medial right calf.  Pain is worsened with ambulation and palpation.  He has been treating his pain with marijuana.  He has not use anything else at home for analgesia.  He was seen in urgent care for the same complaint yesterday.  Due to the discomfort while walking, patient has been utilizing crutches.  Patient denies any other areas of discomfort.  He denies any shortness of breath.     Home Medications Prior to Admission medications   Medication Sig Start Date End Date Taking? Authorizing Provider  albuterol (PROVENTIL HFA;VENTOLIN HFA) 108 (90 Base) MCG/ACT inhaler Inhale 2 puffs into the lungs every 6 (six) hours as needed for wheezing or shortness of breath. 09/20/18   [name], MD  beclomethasone (QVAR REDIHALER) 80 MCG/ACT inhaler Inhale 1 puff into the lungs 2 (two) times daily. 09/20/18   [name], MD  brompheniramine-pseudoephedrine-DM 30-2-10 MG/5ML syrup Take 10 mLs by mouth 4 (four) times daily as needed. 06/16/20   Wieters, Hallie C, PA-C  ELDERBERRY PO Take 1 capsule by mouth.    [provider]  guaifenesin (ROBITUSSIN) 100 MG/5ML syrup Take 5-10 mLs (100-200 mg total) by mouth every 4 (four) hours as needed for cough. 05/16/20   Renne Crigler, PA-C  ibuprofen (ADVIL) 800 MG tablet Take 1 tablet (800 mg total) by mouth 3 (three) times daily. 06/16/20   Wieters, Hallie C, PA-C  sildenafil (VIAGRA)  25 MG tablet Take 1 tablet (25 mg total) by mouth daily as needed for erectile dysfunction. 07/25/20 08/24/20  Towanda Octave, MD  cetirizine (ZYRTEC) 10 MG tablet Take 1 tablet (10 mg total) by mouth daily. 06/07/18 09/12/19  [name], PA-C      Allergies    Patient has no known allergies.    Review of Systems   Review of Systems  Musculoskeletal:  Positive for myalgias.  All other systems reviewed and are negative.  Physical Exam Updated Vital Signs BP 139/87 (BP Location: Right Arm)   Pulse 65   Temp 98.2 F (36.8 C) (Oral)   Resp 16   Ht 6\' 4"  (1.93 m)   Wt 88.5 kg   SpO2 99%   BMI 23.74 kg/m  Physical Exam Vitals and nursing note reviewed.  Constitutional:      General: He is not in acute distress.    Appearance: Normal appearance. He is well-developed and normal weight. He is not ill-appearing, toxic-appearing or diaphoretic.  HENT:     Head: Normocephalic and atraumatic.     Right Ear: External ear normal.     Left Ear: External ear normal.     Nose: Nose normal.     Mouth/Throat:     Mouth: Mucous membranes are moist.     Pharynx: Oropharynx is clear.  Eyes:     Extraocular Movements: Extraocular movements intact.     Conjunctiva/sclera:  Conjunctivae normal.  Cardiovascular:     Rate and Rhythm: Normal rate and regular rhythm.     Heart sounds: No murmur heard. Pulmonary:     Effort: Pulmonary effort is normal. No respiratory distress.     Breath sounds: Normal breath sounds. No stridor. No wheezing, rhonchi or rales.  Abdominal:     Palpations: Abdomen is soft.     Tenderness: There is no abdominal tenderness.  Musculoskeletal:        General: Tenderness (Right calf) present. No swelling or deformity. Normal range of motion.     Cervical back: Normal range of motion and neck supple.     Right lower leg: No edema.     Left lower leg: No edema.  Skin:    General: Skin is warm and dry.     Capillary Refill: Capillary refill takes less than 2 seconds.      Coloration: Skin is not jaundiced or pale.  Neurological:     General: No focal deficit present.     Mental Status: He is alert and oriented to person, place, and time.     Cranial Nerves: No cranial nerve deficit.     Sensory: No sensory deficit.     Motor: No weakness.     Coordination: Coordination normal.  Psychiatric:        Mood and Affect: Mood normal.        Behavior: Behavior normal.        Thought Content: Thought content normal.        Judgment: Judgment normal.    ED Results / Procedures / Treatments   Labs (all labs ordered are listed, but only abnormal results are displayed) Labs Reviewed - No data to display  EKG None  Radiology DG Tibia/Fibula Right  Result Date: 11/03/2021 CLINICAL DATA:  Right calf pain. EXAM: RIGHT TIBIA AND FIBULA - 2 VIEW COMPARISON:  Right knee radiographs 03/07/2021 FINDINGS: Normal bone mineralization. No knee joint effusion. The ankle mortise is symmetric and intact. Mild-to-moderate distal anterior tibial plafond degenerative spurring. IMPRESSION: 1. No acute fracture. 2. Mild-to-moderate distal anterior tibial plafond degenerative spurring at the ankle. Electronically Signed   By: Neita Garnetonald  Viola M.D.   On: 11/03/2021 13:56    Procedures Procedures    Medications Ordered in ED Medications  ketorolac (TORADOL) injection 30 mg (has no administration in time range)    ED Course/ Medical Decision Making/ A&P                           Medical Decision Making Risk Prescription drug management.   Patient is a healthy 26 year old male who presents for right calf pain.  This is following an injury that he sustained while playing basketball 3 days ago.  Mechanism is described as rapid start/stop movements causing him immediate pain to his right calf.  Since that time, he has had continued pain that is worsened with ambulation and palpation.  He has not had any radiation of the pain anywhere else.  He denies any other suspected injuries.  He  has not had any symptoms of shortness of breath.  Patient was seen in urgent care yesterday.  He has been utilizing crutches to minimize pain with ambulation.  He has been treating his pain with marijuana but has not taken anything else for analgesia.  On arrival in the ED, patient is well-appearing.  He is breathing is unlabored.  Lungs are clear to auscultation.  Right  calf does not appear significantly swollen.  It is tender to palpation.  He does have good strength with ankle plantarflexion and dorsiflexion.  I suspect that he has a partial tear of his gastrocnemius muscle.  Compression wrap was provided.  Patient was given Toradol.  He was advised to continue ibuprofen as needed and to weight-bear as tolerated.  Patient was discharged in good condition.        Final Clinical Impression(s) / ED Diagnoses Final diagnoses:  Right calf pain    Rx / DC Orders ED Discharge Orders     None         Gloris Manchester, MD 11/04/21 [phone]

## 2021-11-04 NOTE — Discharge Instructions (Signed)
Continue ibuprofen as needed for pain.  Weight-bear on right leg as tolerated.  Avoid any reinjuries or activities that worsen pain.  Symptoms should resolve with time.  Return to the emergency department for any new or worsening symptoms.

## 2021-11-05 ENCOUNTER — Telehealth: Payer: Self-pay

## 2021-11-05 NOTE — Telephone Encounter (Signed)
Transition Care Management Unsuccessful Follow-up Telephone Call  Date of discharge and from where:  11/04/2021-[facility]  Attempts:  1st Attempt  Reason for unsuccessful TCM follow-up call:  Unable to reach patient

## 2021-11-07 NOTE — Telephone Encounter (Signed)
Transition Care Management Unsuccessful Follow-up Telephone Call  Date of discharge and from where:  11/04/2021-[facility]  Attempts:  2nd Attempt  Reason for unsuccessful TCM follow-up call:  Unable to reach patient

## 2021-11-08 NOTE — Telephone Encounter (Signed)
Transition Care Management Unsuccessful Follow-up Telephone Call  Date of discharge and from where:  11/04/2021-[facility]  Attempts:  3rd Attempt  Reason for unsuccessful TCM follow-up call:  Unable to reach patient

## 2021-11-12 ENCOUNTER — Encounter: Payer: Self-pay | Admitting: *Deleted

## 2022-05-10 ENCOUNTER — Ambulatory Visit
Admission: RE | Admit: 2022-05-10 | Discharge: 2022-05-10 | Disposition: A | Discharge status: Home / Self Care | Payer: Medicaid Other | Source: Ambulatory Visit | Attending: Emergency Medicine | Admitting: Emergency Medicine

## 2022-05-10 VITALS — BP 137/83 | HR 73 | Temp 97.6°F | Resp 18

## 2022-05-10 DIAGNOSIS — M67432 Ganglion, left wrist: Secondary | ICD-10-CM

## 2022-05-10 DIAGNOSIS — L03213 Periorbital cellulitis: Secondary | ICD-10-CM

## 2022-05-10 MED ORDER — CEFDINIR 300 MG PO CAPS: 300.0000 mg | ORAL_CAPSULE | Freq: Two times a day (BID) | ORAL | 0 refills | Status: AC

## 2022-05-10 NOTE — Discharge Instructions (Signed)
Please begin cefdinir 1 capsule twice daily for the next 5 days to resolve the infection on your right lower eyelid.  If you do not see meaningful improvement of your infection after 48 hours of taking cefdinir, please go to the emergency room for further evaluation and culture of the wound.  This will help the provider to provide you with more specific treatment, these are test that we cannot perform here at urgent care.  Please follow-up with your primary provider regarding definitive treatment of ganglion cyst.  In the meantime, I provided you with a handout give you information about how to care for this at home.  Thank you for visiting urgent care today.

## 2022-05-10 NOTE — ED Triage Notes (Signed)
Pt reports a rash under right eye x 2 days and a "bump" on left hand. States the bump has been present for a couple months.
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
## 2022-05-23 ENCOUNTER — Ambulatory Visit (INDEPENDENT_AMBULATORY_CARE_PROVIDER_SITE_OTHER): Payer: Medicaid Other

## 2022-05-23 ENCOUNTER — Ambulatory Visit
Admission: EM | Admit: 2022-05-23 | Discharge: 2022-05-23 | Disposition: A | Discharge status: Home / Self Care | Payer: Medicaid Other | Attending: Physician Assistant | Admitting: Physician Assistant

## 2022-05-23 ENCOUNTER — Encounter: Payer: Self-pay | Admitting: Emergency Medicine

## 2022-05-23 DIAGNOSIS — M25532 Pain in left wrist: Secondary | ICD-10-CM

## 2022-05-23 DIAGNOSIS — S66912A Strain of unspecified muscle, fascia and tendon at wrist and hand level, left hand, initial encounter: Secondary | ICD-10-CM

## 2022-05-23 DIAGNOSIS — S7001XA Contusion of right hip, initial encounter: Secondary | ICD-10-CM

## 2022-05-23 DIAGNOSIS — M25551 Pain in right hip: Secondary | ICD-10-CM

## 2022-05-23 MED ORDER — TIZANIDINE HCL 4 MG PO TABS: 4.0000 mg | ORAL_TABLET | Freq: Four times a day (QID) | ORAL | 0 refills | Status: DC | PRN

## 2022-05-23 MED ORDER — IBUPROFEN 800 MG PO TABS: 800.0000 mg | ORAL_TABLET | Freq: Three times a day (TID) | ORAL | 0 refills | Status: DC
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
## 2022-05-23 NOTE — Discharge Instructions (Addendum)
Advised to take the ibuprofen 800 mg every 8 hours with food to help relieve the pain and discomfort. Advised take the Zanaflex 4 mg every 6 hours to help reduce muscle spasm and irritability. Advised to follow-up with PCP or return to urgent care if symptoms fail to improve.

## 2023-06-14 ENCOUNTER — Ambulatory Visit (HOSPITAL_COMMUNITY)
Admission: EM | Admit: 2023-06-14 | Discharge: 2023-06-14 | Disposition: A | Discharge status: Home / Self Care | Payer: Medicaid Other | Attending: Emergency Medicine | Admitting: Emergency Medicine

## 2023-06-14 ENCOUNTER — Encounter (HOSPITAL_COMMUNITY): Payer: Self-pay | Admitting: Emergency Medicine

## 2023-06-14 DIAGNOSIS — R051 Acute cough: Secondary | ICD-10-CM | POA: Diagnosis not present

## 2023-06-14 DIAGNOSIS — J069 Acute upper respiratory infection, unspecified: Secondary | ICD-10-CM

## 2023-06-14 MED ORDER — BENZONATATE 100 MG PO CAPS: 100.0000 mg | ORAL_CAPSULE | Freq: Three times a day (TID) | ORAL | 0 refills | Status: AC

## 2023-06-14 MED ORDER — DEXAMETHASONE SODIUM PHOSPHATE 10 MG/ML IJ SOLN
INTRAMUSCULAR | Status: AC
  Filled 2023-06-14: qty 1

## 2023-06-14 MED ORDER — DEXAMETHASONE SODIUM PHOSPHATE 10 MG/ML IJ SOLN
10.0000 mg | Freq: Once | INTRAMUSCULAR | Status: AC
  Administered 2023-06-14: 10 mg via INTRAMUSCULAR
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
## 2023-06-14 NOTE — Discharge Instructions (Addendum)
 Your cough is most likely lingering from a viral infection.  We have given you a one-time steroid dose in clinic to help with the inflammation in your lungs.  You can use your albuterol  inhaler that you have at home as needed for any wheezing or shortness of breath.  Sleeping with a humidifier can help loosen up any secretions, warm saline gargles, tea with honey and over-the-counter cough drops can help soothe your sore throat as well.  Use the cough medicine every 8 hours as needed to help suppress her cough.  Return to clinic or follow-up with your primary care provider if you develop no improvement in symptoms over the next week, any fever, fatigue, or new concerning symptoms.

## 2023-06-14 NOTE — ED Triage Notes (Addendum)
 Pt c/o cough, congestion, sore throat, and runny nose for 1 week.

## 2024-06-25 ENCOUNTER — Emergency Department (HOSPITAL_COMMUNITY)

## 2024-06-25 ENCOUNTER — Emergency Department (HOSPITAL_COMMUNITY)
Admission: EM | Admit: 2024-06-25 | Discharge: 2024-06-25 | Disposition: A | Discharge status: Home / Self Care | Attending: Emergency Medicine | Admitting: Emergency Medicine

## 2024-06-25 ENCOUNTER — Other Ambulatory Visit: Payer: Self-pay

## 2024-06-25 ENCOUNTER — Encounter (HOSPITAL_COMMUNITY): Payer: Self-pay

## 2024-06-25 ENCOUNTER — Encounter (HOSPITAL_COMMUNITY): Payer: Self-pay | Admitting: Emergency Medicine

## 2024-06-25 DIAGNOSIS — W25XXXA Contact with sharp glass, initial encounter: Secondary | ICD-10-CM

## 2024-06-25 DIAGNOSIS — M7622 Iliac crest spur, left hip: Secondary | ICD-10-CM | POA: Insufficient documentation

## 2024-06-25 DIAGNOSIS — Y903 Blood alcohol level of 60-79 mg/100 ml: Secondary | ICD-10-CM | POA: Insufficient documentation

## 2024-06-25 DIAGNOSIS — S60511A Abrasion of right hand, initial encounter: Secondary | ICD-10-CM | POA: Insufficient documentation

## 2024-06-25 DIAGNOSIS — M25512 Pain in left shoulder: Secondary | ICD-10-CM | POA: Insufficient documentation

## 2024-06-25 DIAGNOSIS — Y92410 Unspecified street and highway as the place of occurrence of the external cause: Secondary | ICD-10-CM | POA: Insufficient documentation

## 2024-06-25 DIAGNOSIS — M7621 Iliac crest spur, right hip: Secondary | ICD-10-CM | POA: Insufficient documentation

## 2024-06-25 DIAGNOSIS — F10129 Alcohol abuse with intoxication, unspecified: Secondary | ICD-10-CM | POA: Insufficient documentation

## 2024-06-25 DIAGNOSIS — T148XXA Other injury of unspecified body region, initial encounter: Secondary | ICD-10-CM

## 2024-06-25 DIAGNOSIS — R6884 Jaw pain: Secondary | ICD-10-CM | POA: Insufficient documentation

## 2024-06-25 LAB — BASIC METABOLIC PANEL WITH GFR
Anion gap: 14 (ref 5–15)
BUN: 14 mg/dL (ref 6–20)
CO2: 24 mmol/L (ref 22–32)
Calcium: 9.4 mg/dL (ref 8.9–10.3)
Chloride: 105 mmol/L (ref 98–111)
Creatinine, Ser: 1.09 mg/dL (ref 0.61–1.24)
GFR, Estimated: >60 mL/min
Glucose, Bld: 105 mg/dL — ABNORMAL HIGH (ref 70–99)
Potassium: 3.5 mmol/L (ref 3.5–5.1)
Sodium: 142 mmol/L (ref 135–145)

## 2024-06-25 LAB — CBC WITH DIFFERENTIAL/PLATELET
Abs Immature Granulocytes: 0.01 K/uL (ref 0.00–0.07)
Basophils Absolute: 0 K/uL (ref 0.0–0.1)
Basophils Relative: 0 %
Eosinophils Absolute: 0.1 K/uL (ref 0.0–0.5)
Eosinophils Relative: 1 %
HCT: 40 % (ref 39.0–52.0)
Hemoglobin: 14.4 g/dL (ref 13.0–17.0)
Immature Granulocytes: 0 %
Lymphocytes Relative: 37 %
Lymphs Abs: 2 K/uL (ref 0.7–4.0)
MCH: 30.4 pg (ref 26.0–34.0)
MCHC: 36 g/dL (ref 30.0–36.0)
MCV: 84.6 fL (ref 80.0–100.0)
Monocytes Absolute: 0.5 K/uL (ref 0.1–1.0)
Monocytes Relative: 9 %
Neutro Abs: 3 K/uL (ref 1.7–7.7)
Neutrophils Relative %: 53 %
Platelets: 174 K/uL (ref 150–400)
RBC: 4.73 MIL/uL (ref 4.22–5.81)
RDW: 12.3 % (ref 11.5–15.5)
WBC: 5.5 K/uL (ref 4.0–10.5)
nRBC: 0 % (ref 0.0–0.2)

## 2024-06-25 LAB — ETHANOL: Alcohol, Ethyl (B): 61 mg/dL — ABNORMAL HIGH

## 2024-06-25 MED ORDER — LIDOCAINE 5 % EX PTCH
1.0000 | MEDICATED_PATCH | CUTANEOUS | Status: DC
  Administered 2024-06-25: 1 via TRANSDERMAL
  Filled 2024-06-25: qty 1

## 2024-06-25 MED ORDER — METHOCARBAMOL 500 MG PO TABS: 500.0000 mg | ORAL_TABLET | Freq: Two times a day (BID) | ORAL | 0 refills | Status: AC

## 2024-06-25 MED ORDER — LIDOCAINE 4 % EX PTCH: 1.0000 | MEDICATED_PATCH | Freq: Two times a day (BID) | CUTANEOUS | 0 refills | Status: AC

## 2024-06-25 MED ORDER — IBUPROFEN 600 MG PO TABS: 600.0000 mg | ORAL_TABLET | Freq: Four times a day (QID) | ORAL | 0 refills | Status: AC | PRN

## 2024-06-25 MED ORDER — ACETAMINOPHEN 325 MG PO TABS
650.0000 mg | ORAL_TABLET | Freq: Once | ORAL | Status: AC
  Administered 2024-06-25: 650 mg via ORAL
  Filled 2024-06-25: qty 2

## 2024-06-25 MED ORDER — IBUPROFEN 800 MG PO TABS
800.0000 mg | ORAL_TABLET | Freq: Once | ORAL | Status: AC
  Administered 2024-06-25: 800 mg via ORAL
  Filled 2024-06-25: qty 1

## 2024-06-25 MED ORDER — METHOCARBAMOL 500 MG PO TABS
500.0000 mg | ORAL_TABLET | Freq: Once | ORAL | Status: AC
  Administered 2024-06-25: 500 mg via ORAL
  Filled 2024-06-25: qty 1

## 2024-06-25 MED ORDER — IBUPROFEN 200 MG PO TABS
600.0000 mg | ORAL_TABLET | Freq: Once | ORAL | Status: AC
  Administered 2024-06-25: 600 mg via ORAL
  Filled 2024-06-25: qty 3
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
## 2024-06-25 NOTE — Discharge Instructions (Signed)
 Your imaging was reassuring this evening.  You may take ibuprofen  and Tylenol  for pain control.  I have prescribed Robaxin  which is a muscle relaxant.  Please note that this may cause drowsiness and should not be taken if you plan to drive or are performing activities which require your full attention.  Follow-up as needed with your primary care provider.  Return to the emergency department if you develop any life-threatening symptoms
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
## 2024-06-25 NOTE — ED Notes (Signed)
 Discharge instructions reviewed with patient at this time. Follow up care and return precautions discussed. Pt verbalized understanding, denies questions. All belongings with patient at time of discharge. ABC's intact, pt at baseline mental status.
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
## 2024-06-27 ENCOUNTER — Ambulatory Visit (INDEPENDENT_AMBULATORY_CARE_PROVIDER_SITE_OTHER)

## 2024-06-27 ENCOUNTER — Ambulatory Visit: Admission: EM | Admit: 2024-06-27 | Discharge: 2024-06-27 | Disposition: A | Discharge status: Home / Self Care

## 2024-06-27 ENCOUNTER — Encounter: Payer: Self-pay | Admitting: Emergency Medicine

## 2024-06-27 DIAGNOSIS — S60551A Superficial foreign body of right hand, initial encounter: Secondary | ICD-10-CM

## 2024-06-27 DIAGNOSIS — M7918 Myalgia, other site: Secondary | ICD-10-CM | POA: Diagnosis not present

## 2024-06-27 MED ORDER — KETOROLAC TROMETHAMINE 30 MG/ML IJ SOLN
30.0000 mg | Freq: Once | INTRAMUSCULAR | Status: AC
  Administered 2024-06-27: 30 mg via INTRAMUSCULAR

## 2024-06-27 MED ORDER — DICLOFENAC SODIUM 75 MG PO TBEC: 75.0000 mg | DELAYED_RELEASE_TABLET | Freq: Two times a day (BID) | ORAL | 0 refills | Status: AC

## 2024-06-27 MED ORDER — DEXAMETHASONE SOD PHOSPHATE PF 10 MG/ML IJ SOLN
10.0000 mg | Freq: Once | INTRAMUSCULAR | Status: AC
  Administered 2024-06-27: 10 mg via INTRAMUSCULAR
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
## 2024-06-27 NOTE — Discharge Instructions (Signed)
 Use diclofenac  in addition to robaxin  (muscle relaxer). You may use ice on affected areas for 20 mins at a times a couple times a day for the first 24 hrs then switch to heat.   Unable to removal glass foreign body in office and it is too deep, may try soaking in epsolm salt in hopes that it will drawn the piece of glass out.

## 2024-06-29 ENCOUNTER — Encounter (HOSPITAL_BASED_OUTPATIENT_CLINIC_OR_DEPARTMENT_OTHER): Payer: Self-pay | Admitting: Emergency Medicine

## 2024-06-29 ENCOUNTER — Emergency Department (HOSPITAL_BASED_OUTPATIENT_CLINIC_OR_DEPARTMENT_OTHER)
Admission: EM | Admit: 2024-06-29 | Discharge: 2024-06-29 | Disposition: A | Discharge status: Home / Self Care | Attending: Emergency Medicine | Admitting: Emergency Medicine

## 2024-06-29 ENCOUNTER — Other Ambulatory Visit: Payer: Self-pay

## 2024-06-29 DIAGNOSIS — M542 Cervicalgia: Secondary | ICD-10-CM | POA: Diagnosis not present

## 2024-06-29 DIAGNOSIS — M79605 Pain in left leg: Secondary | ICD-10-CM | POA: Insufficient documentation

## 2024-06-29 DIAGNOSIS — M5489 Other dorsalgia: Secondary | ICD-10-CM | POA: Diagnosis not present

## 2024-06-29 DIAGNOSIS — R519 Headache, unspecified: Secondary | ICD-10-CM | POA: Diagnosis not present

## 2024-06-29 DIAGNOSIS — M549 Dorsalgia, unspecified: Secondary | ICD-10-CM | POA: Diagnosis present
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
